# Patient Record
Sex: Male | Born: 1935 | Race: White | Hispanic: No | Marital: Married | Smoking: Former smoker
Health system: Southern US, Community
[De-identification: ages and names within clinical notes are randomized; demographics above are authoritative.]

## PROBLEM LIST (undated history)

## (undated) DIAGNOSIS — G4700 Insomnia, unspecified: Secondary | ICD-10-CM

## (undated) DIAGNOSIS — J449 Chronic obstructive pulmonary disease, unspecified: Secondary | ICD-10-CM

## (undated) DIAGNOSIS — J189 Pneumonia, unspecified organism: Secondary | ICD-10-CM

---

## 2005-01-30 ENCOUNTER — Ambulatory Visit: Payer: Self-pay | Admitting: Internal Medicine

## 2005-02-12 ENCOUNTER — Ambulatory Visit: Payer: Self-pay | Admitting: Vascular Surgery

## 2005-03-04 ENCOUNTER — Ambulatory Visit: Payer: Self-pay | Admitting: Internal Medicine

## 2005-03-26 ENCOUNTER — Inpatient Hospital Stay: Payer: Self-pay | Admitting: Vascular Surgery

## 2007-04-27 ENCOUNTER — Ambulatory Visit: Payer: Self-pay | Admitting: Unknown Physician Specialty

## 2008-08-02 ENCOUNTER — Emergency Department: Payer: Self-pay | Admitting: Emergency Medicine

## 2009-10-09 ENCOUNTER — Ambulatory Visit: Payer: Self-pay | Admitting: Unknown Physician Specialty

## 2009-12-26 ENCOUNTER — Ambulatory Visit: Payer: Self-pay | Admitting: Diagnostic Radiology

## 2010-07-30 ENCOUNTER — Ambulatory Visit: Payer: Self-pay | Admitting: Unknown Physician Specialty

## 2010-12-12 ENCOUNTER — Ambulatory Visit: Payer: Self-pay

## 2011-05-21 ENCOUNTER — Ambulatory Visit: Payer: Self-pay | Admitting: Oncology

## 2011-05-31 ENCOUNTER — Ambulatory Visit: Payer: Self-pay | Admitting: Internal Medicine

## 2011-06-04 ENCOUNTER — Ambulatory Visit: Payer: Self-pay | Admitting: Oncology

## 2011-06-11 ENCOUNTER — Ambulatory Visit: Payer: Self-pay | Admitting: Sports Medicine

## 2011-06-18 ENCOUNTER — Ambulatory Visit: Payer: Self-pay | Admitting: Oncology

## 2011-06-18 LAB — CBC CANCER CENTER
Basophil #: 0 x10 3/mm (ref 0.0–0.1)
Basophil %: 0.3 %
Eosinophil %: 4.4 %
HCT: 29.3 % — ABNORMAL LOW (ref 40.0–52.0)
HGB: 9.7 g/dL — ABNORMAL LOW (ref 13.0–18.0)
Lymphocyte #: 1.3 x10 3/mm (ref 1.0–3.6)
Lymphocyte %: 15.7 %
MCHC: 33 g/dL (ref 32.0–36.0)
MCV: 84 fL (ref 80–100)
Monocyte #: 0.6 x10 3/mm (ref 0.0–0.7)
Monocyte %: 7 %
Neutrophil %: 72.6 %
RBC: 3.5 10*6/uL — ABNORMAL LOW (ref 4.40–5.90)

## 2011-06-18 LAB — IRON AND TIBC
Iron Bind.Cap.(Total): 336 ug/dL (ref 250–450)
Iron Saturation: 16 %
Iron: 53 ug/dL — ABNORMAL LOW (ref 65–175)

## 2011-06-18 LAB — FERRITIN: Ferritin (ARMC): 26 ng/mL (ref 8–388)

## 2011-07-05 ENCOUNTER — Ambulatory Visit: Payer: Self-pay | Admitting: Oncology

## 2011-08-02 ENCOUNTER — Ambulatory Visit: Payer: Self-pay | Admitting: Oncology

## 2011-09-02 ENCOUNTER — Ambulatory Visit: Payer: Self-pay | Admitting: Oncology

## 2011-09-24 LAB — CANCER CENTER HEMOGLOBIN: HGB: 11 g/dL — ABNORMAL LOW (ref 13.0–18.0)

## 2011-10-02 ENCOUNTER — Ambulatory Visit: Payer: Self-pay | Admitting: Oncology

## 2011-11-02 ENCOUNTER — Ambulatory Visit: Payer: Self-pay | Admitting: Oncology

## 2011-11-19 LAB — CANCER CENTER HEMOGLOBIN: HGB: 10.5 g/dL — ABNORMAL LOW (ref 13.0–18.0)

## 2011-12-02 ENCOUNTER — Ambulatory Visit: Payer: Self-pay | Admitting: Oncology

## 2012-01-14 ENCOUNTER — Ambulatory Visit: Payer: Self-pay | Admitting: Oncology

## 2012-01-14 LAB — CANCER CENTER HEMOGLOBIN: HGB: 10.5 g/dL — ABNORMAL LOW (ref 13.0–18.0)

## 2012-01-14 LAB — FERRITIN: Ferritin (ARMC): 33 ng/mL (ref 8–388)

## 2012-01-14 LAB — IRON AND TIBC: Iron Saturation: 23 %

## 2012-02-02 ENCOUNTER — Ambulatory Visit: Payer: Self-pay | Admitting: Oncology

## 2012-02-07 ENCOUNTER — Ambulatory Visit: Payer: Self-pay | Admitting: Internal Medicine

## 2012-02-07 LAB — CREATININE, SERUM: Creatinine: 2.06 mg/dL — ABNORMAL HIGH (ref 0.60–1.30)

## 2012-03-10 ENCOUNTER — Ambulatory Visit: Payer: Self-pay | Admitting: Oncology

## 2012-04-03 ENCOUNTER — Ambulatory Visit: Payer: Self-pay | Admitting: Oncology

## 2012-05-05 ENCOUNTER — Ambulatory Visit: Payer: Self-pay | Admitting: Oncology

## 2012-05-05 LAB — CANCER CENTER HEMOGLOBIN: HGB: 10.9 g/dL — ABNORMAL LOW (ref 13.0–18.0)

## 2012-06-03 ENCOUNTER — Ambulatory Visit: Payer: Self-pay | Admitting: Oncology

## 2012-08-01 ENCOUNTER — Ambulatory Visit: Payer: Self-pay | Admitting: Oncology

## 2012-08-03 LAB — CBC CANCER CENTER
Basophil #: 0 x10 3/mm (ref 0.0–0.1)
Eosinophil #: 0.3 x10 3/mm (ref 0.0–0.7)
HCT: 35.4 % — ABNORMAL LOW (ref 40.0–52.0)
HGB: 12 g/dL — ABNORMAL LOW (ref 13.0–18.0)
Lymphocyte #: 1.7 x10 3/mm (ref 1.0–3.6)
Lymphocyte %: 24.6 %
MCHC: 33.9 g/dL (ref 32.0–36.0)
MCV: 87 fL (ref 80–100)
Neutrophil #: 4.4 x10 3/mm (ref 1.4–6.5)
Platelet: 160 x10 3/mm (ref 150–440)
WBC: 7 x10 3/mm (ref 3.8–10.6)

## 2012-08-03 LAB — CREATININE, SERUM: Creatinine: 2.13 mg/dL — ABNORMAL HIGH (ref 0.60–1.30)

## 2012-08-03 LAB — IRON AND TIBC
Iron Saturation: 23 %
Unbound Iron-Bind.Cap.: 254 ug/dL

## 2012-09-01 ENCOUNTER — Ambulatory Visit: Payer: Self-pay | Admitting: Oncology

## 2012-10-20 ENCOUNTER — Ambulatory Visit: Payer: Self-pay | Admitting: Ophthalmology

## 2012-10-20 LAB — CBC WITH DIFFERENTIAL/PLATELET
Basophil #: 0.1 10*3/uL (ref 0.0–0.1)
Basophil %: 0.8 %
Eosinophil #: 0.2 10*3/uL (ref 0.0–0.7)
HCT: 33.2 % — ABNORMAL LOW (ref 40.0–52.0)
HGB: 11 g/dL — ABNORMAL LOW (ref 13.0–18.0)
Lymphocyte #: 1.3 10*3/uL (ref 1.0–3.6)
Lymphocyte %: 14.1 %
MCH: 28.6 pg (ref 26.0–34.0)
MCHC: 33 g/dL (ref 32.0–36.0)
MCV: 87 fL (ref 80–100)
Monocyte #: 0.7 x10 3/mm (ref 0.2–1.0)
Monocyte %: 8 %
Platelet: 300 10*3/uL (ref 150–440)
RBC: 3.83 10*6/uL — ABNORMAL LOW (ref 4.40–5.90)
RDW: 15.4 % — ABNORMAL HIGH (ref 11.5–14.5)

## 2012-10-20 LAB — POTASSIUM: Potassium: 4.2 mmol/L (ref 3.5–5.1)

## 2012-11-02 ENCOUNTER — Ambulatory Visit: Payer: Self-pay | Admitting: Ophthalmology

## 2013-05-18 ENCOUNTER — Ambulatory Visit: Payer: Self-pay | Admitting: Ophthalmology

## 2013-05-18 LAB — CBC WITH DIFFERENTIAL/PLATELET
Eosinophil #: 0.1 10*3/uL (ref 0.0–0.7)
Eosinophil %: 1.8 %
HGB: 11.5 g/dL — ABNORMAL LOW (ref 13.0–18.0)
Lymphocyte #: 1.6 10*3/uL (ref 1.0–3.6)
Lymphocyte %: 18.6 %
MCH: 30.4 pg (ref 26.0–34.0)
MCV: 88 fL (ref 80–100)
Monocyte #: 0.6 x10 3/mm (ref 0.2–1.0)
Monocyte %: 6.5 %
RDW: 15.4 % — ABNORMAL HIGH (ref 11.5–14.5)
WBC: 8.5 10*3/uL (ref 3.8–10.6)

## 2013-05-18 LAB — POTASSIUM: Potassium: 3.8 mmol/L (ref 3.5–5.1)

## 2013-05-31 ENCOUNTER — Ambulatory Visit: Payer: Self-pay | Admitting: Ophthalmology

## 2013-07-17 ENCOUNTER — Inpatient Hospital Stay: Payer: Self-pay | Admitting: Psychiatry

## 2013-07-17 LAB — COMPREHENSIVE METABOLIC PANEL
ALT: 18 U/L (ref 12–78)
AST: 19 U/L (ref 15–37)
Albumin: 3.6 g/dL (ref 3.4–5.0)
Alkaline Phosphatase: 75 U/L
Anion Gap: 8 (ref 7–16)
BUN: 53 mg/dL — AB (ref 7–18)
Bilirubin,Total: 0.8 mg/dL (ref 0.2–1.0)
CHLORIDE: 99 mmol/L (ref 98–107)
CREATININE: 2.34 mg/dL — AB (ref 0.60–1.30)
Calcium, Total: 9.4 mg/dL (ref 8.5–10.1)
Co2: 27 mmol/L (ref 21–32)
EGFR (African American): 28 — ABNORMAL LOW
GFR CALC NON AF AMER: 24 — AB
Glucose: 119 mg/dL — ABNORMAL HIGH (ref 65–99)
OSMOLALITY: 284 (ref 275–301)
Potassium: 3.6 mmol/L (ref 3.5–5.1)
Sodium: 134 mmol/L — ABNORMAL LOW (ref 136–145)
Total Protein: 6.9 g/dL (ref 6.4–8.2)

## 2013-07-17 LAB — URINALYSIS, COMPLETE
BLOOD: NEGATIVE
Bacteria: NONE SEEN
Bilirubin,UR: NEGATIVE
Glucose,UR: NEGATIVE mg/dL (ref 0–75)
Hyaline Cast: 3
KETONE: NEGATIVE
LEUKOCYTE ESTERASE: NEGATIVE
Nitrite: NEGATIVE
PROTEIN: NEGATIVE
Ph: 6 (ref 4.5–8.0)
SPECIFIC GRAVITY: 1.005 (ref 1.003–1.030)
SQUAMOUS EPITHELIAL: NONE SEEN
WBC UR: <1 /HPF (ref 0–5)

## 2013-07-17 LAB — CBC
HCT: 37 % — AB (ref 40.0–52.0)
HGB: 12.6 g/dL — ABNORMAL LOW (ref 13.0–18.0)
MCH: 30.2 pg (ref 26.0–34.0)
MCHC: 33.9 g/dL (ref 32.0–36.0)
MCV: 89 fL (ref 80–100)
Platelet: 228 10*3/uL (ref 150–440)
RBC: 4.16 10*6/uL — ABNORMAL LOW (ref 4.40–5.90)
RDW: 14.7 % — AB (ref 11.5–14.5)
WBC: 11.4 10*3/uL — ABNORMAL HIGH (ref 3.8–10.6)

## 2013-07-17 LAB — SALICYLATE LEVEL: Salicylates, Serum: <1.7 mg/dL

## 2013-07-17 LAB — DRUG SCREEN, URINE
AMPHETAMINES, UR SCREEN: NEGATIVE (ref ?–1000)
Barbiturates, Ur Screen: NEGATIVE (ref ?–200)
Benzodiazepine, Ur Scrn: NEGATIVE (ref ?–200)
Cannabinoid 50 Ng, Ur ~~LOC~~: NEGATIVE (ref ?–50)
Cocaine Metabolite,Ur ~~LOC~~: NEGATIVE (ref ?–300)
MDMA (ECSTASY) UR SCREEN: NEGATIVE (ref ?–500)
METHADONE, UR SCREEN: NEGATIVE (ref ?–300)
Opiate, Ur Screen: POSITIVE (ref ?–300)
PHENCYCLIDINE (PCP) UR S: NEGATIVE (ref ?–25)
Tricyclic, Ur Screen: NEGATIVE (ref ?–1000)

## 2013-07-17 LAB — ETHANOL
Ethanol %: <0.003 % (ref 0.000–0.080)
Ethanol: <3 mg/dL

## 2013-07-17 LAB — ACETAMINOPHEN LEVEL: ACETAMINOPHEN: 4 ug/mL — AB

## 2013-07-17 LAB — TSH: THYROID STIMULATING HORM: 1.78 u[IU]/mL

## 2014-02-20 ENCOUNTER — Emergency Department: Payer: Self-pay | Admitting: Emergency Medicine

## 2014-02-20 LAB — URINALYSIS, COMPLETE
Bacteria: NONE SEEN
Bilirubin,UR: NEGATIVE
Blood: NEGATIVE
Glucose,UR: NEGATIVE mg/dL (ref 0–75)
KETONE: NEGATIVE
LEUKOCYTE ESTERASE: NEGATIVE
Nitrite: NEGATIVE
Ph: 5 (ref 4.5–8.0)
Protein: NEGATIVE
SPECIFIC GRAVITY: 1.005 (ref 1.003–1.030)

## 2014-02-20 LAB — BASIC METABOLIC PANEL
Anion Gap: 9 (ref 7–16)
BUN: 55 mg/dL — ABNORMAL HIGH (ref 7–18)
CHLORIDE: 97 mmol/L — AB (ref 98–107)
CO2: 24 mmol/L (ref 21–32)
Calcium, Total: 8.4 mg/dL — ABNORMAL LOW (ref 8.5–10.1)
Creatinine: 2.47 mg/dL — ABNORMAL HIGH (ref 0.60–1.30)
EGFR (African American): 26 — ABNORMAL LOW
EGFR (Non-African Amer.): 22 — ABNORMAL LOW
GLUCOSE: 117 mg/dL — AB (ref 65–99)
Osmolality: 277 (ref 275–301)
Potassium: 3.9 mmol/L (ref 3.5–5.1)
SODIUM: 130 mmol/L — AB (ref 136–145)

## 2014-02-20 LAB — CBC
HCT: 31.8 % — ABNORMAL LOW (ref 40.0–52.0)
HGB: 10.1 g/dL — AB (ref 13.0–18.0)
MCH: 26.7 pg (ref 26.0–34.0)
MCHC: 31.6 g/dL — ABNORMAL LOW (ref 32.0–36.0)
MCV: 85 fL (ref 80–100)
PLATELETS: 188 10*3/uL (ref 150–440)
RBC: 3.76 10*6/uL — ABNORMAL LOW (ref 4.40–5.90)
RDW: 17.1 % — ABNORMAL HIGH (ref 11.5–14.5)
WBC: 13.4 10*3/uL — ABNORMAL HIGH (ref 3.8–10.6)

## 2014-02-20 LAB — TROPONIN I: Troponin-I: <0.02 ng/mL

## 2014-02-21 ENCOUNTER — Inpatient Hospital Stay: Payer: Self-pay | Admitting: Internal Medicine

## 2014-02-22 LAB — CBC WITH DIFFERENTIAL/PLATELET
BASOS ABS: 0 10*3/uL (ref 0.0–0.1)
Basophil %: 0.2 %
Eosinophil #: 0 10*3/uL (ref 0.0–0.7)
Eosinophil %: 0.2 %
HCT: 30.6 % — AB (ref 40.0–52.0)
HGB: 10.1 g/dL — ABNORMAL LOW (ref 13.0–18.0)
LYMPHS PCT: 6.4 %
Lymphocyte #: 0.7 10*3/uL — ABNORMAL LOW (ref 1.0–3.6)
MCH: 27.5 pg (ref 26.0–34.0)
MCHC: 33 g/dL (ref 32.0–36.0)
MCV: 83 fL (ref 80–100)
MONO ABS: 1 x10 3/mm (ref 0.2–1.0)
Monocyte %: 8.7 %
Neutrophil #: 9.8 10*3/uL — ABNORMAL HIGH (ref 1.4–6.5)
Neutrophil %: 84.5 %
Platelet: 197 10*3/uL (ref 150–440)
RBC: 3.67 10*6/uL — ABNORMAL LOW (ref 4.40–5.90)
RDW: 17.3 % — AB (ref 11.5–14.5)
WBC: 11.6 10*3/uL — ABNORMAL HIGH (ref 3.8–10.6)

## 2014-02-22 LAB — BASIC METABOLIC PANEL
ANION GAP: 9 (ref 7–16)
BUN: 47 mg/dL — AB (ref 7–18)
CHLORIDE: 99 mmol/L (ref 98–107)
CREATININE: 2.49 mg/dL — AB (ref 0.60–1.30)
Calcium, Total: 8.3 mg/dL — ABNORMAL LOW (ref 8.5–10.1)
Co2: 25 mmol/L (ref 21–32)
EGFR (Non-African Amer.): 22 — ABNORMAL LOW
GFR CALC AF AMER: 26 — AB
GLUCOSE: 103 mg/dL — AB (ref 65–99)
OSMOLALITY: 279 (ref 275–301)
POTASSIUM: 3.8 mmol/L (ref 3.5–5.1)
Sodium: 133 mmol/L — ABNORMAL LOW (ref 136–145)

## 2014-02-23 LAB — CBC WITH DIFFERENTIAL/PLATELET
BASOS PCT: 0.2 %
Basophil #: 0 10*3/uL (ref 0.0–0.1)
EOS ABS: 0.1 10*3/uL (ref 0.0–0.7)
Eosinophil %: 1 %
HCT: 31.7 % — ABNORMAL LOW (ref 40.0–52.0)
HGB: 10.3 g/dL — ABNORMAL LOW (ref 13.0–18.0)
LYMPHS ABS: 0.7 10*3/uL — AB (ref 1.0–3.6)
Lymphocyte %: 6 %
MCH: 27.4 pg (ref 26.0–34.0)
MCHC: 32.5 g/dL (ref 32.0–36.0)
MCV: 84 fL (ref 80–100)
MONOS PCT: 7.7 %
Monocyte #: 0.9 x10 3/mm (ref 0.2–1.0)
NEUTROS ABS: 9.9 10*3/uL — AB (ref 1.4–6.5)
Neutrophil %: 85.1 %
PLATELETS: 226 10*3/uL (ref 150–440)
RBC: 3.76 10*6/uL — ABNORMAL LOW (ref 4.40–5.90)
RDW: 17.6 % — ABNORMAL HIGH (ref 11.5–14.5)
WBC: 11.6 10*3/uL — ABNORMAL HIGH (ref 3.8–10.6)

## 2014-02-23 LAB — BASIC METABOLIC PANEL
ANION GAP: 9 (ref 7–16)
BUN: 47 mg/dL — ABNORMAL HIGH (ref 7–18)
CREATININE: 2.39 mg/dL — AB (ref 0.60–1.30)
Calcium, Total: 8.6 mg/dL (ref 8.5–10.1)
Chloride: 101 mmol/L (ref 98–107)
Co2: 24 mmol/L (ref 21–32)
EGFR (Non-African Amer.): 23 — ABNORMAL LOW
GFR CALC AF AMER: 27 — AB
GLUCOSE: 112 mg/dL — AB (ref 65–99)
Osmolality: 281 (ref 275–301)
Potassium: 3.8 mmol/L (ref 3.5–5.1)
Sodium: 134 mmol/L — ABNORMAL LOW (ref 136–145)

## 2014-02-24 LAB — BASIC METABOLIC PANEL
Anion Gap: 9 (ref 7–16)
BUN: 49 mg/dL — ABNORMAL HIGH (ref 7–18)
CHLORIDE: 105 mmol/L (ref 98–107)
Calcium, Total: 8.1 mg/dL — ABNORMAL LOW (ref 8.5–10.1)
Co2: 24 mmol/L (ref 21–32)
Creatinine: 2.01 mg/dL — ABNORMAL HIGH (ref 0.60–1.30)
EGFR (African American): 41 — ABNORMAL LOW
EGFR (Non-African Amer.): 33 — ABNORMAL LOW
Glucose: 99 mg/dL (ref 65–99)
Osmolality: 289 (ref 275–301)
Potassium: 4.2 mmol/L (ref 3.5–5.1)
Sodium: 138 mmol/L (ref 136–145)

## 2014-02-24 LAB — CBC WITH DIFFERENTIAL/PLATELET
BASOS ABS: 0 10*3/uL (ref 0.0–0.1)
BASOS PCT: 0.3 %
Eosinophil #: 0.2 10*3/uL (ref 0.0–0.7)
Eosinophil %: 2.2 %
HCT: 29.1 % — ABNORMAL LOW (ref 40.0–52.0)
HGB: 9.7 g/dL — ABNORMAL LOW (ref 13.0–18.0)
LYMPHS PCT: 8.3 %
Lymphocyte #: 0.8 10*3/uL — ABNORMAL LOW (ref 1.0–3.6)
MCH: 28.1 pg (ref 26.0–34.0)
MCHC: 33.5 g/dL (ref 32.0–36.0)
MCV: 84 fL (ref 80–100)
MONO ABS: 0.7 x10 3/mm (ref 0.2–1.0)
MONOS PCT: 7.8 %
NEUTROS PCT: 81.4 %
Neutrophil #: 7.7 10*3/uL — ABNORMAL HIGH (ref 1.4–6.5)
PLATELETS: 224 10*3/uL (ref 150–440)
RBC: 3.46 10*6/uL — ABNORMAL LOW (ref 4.40–5.90)
RDW: 17.3 % — ABNORMAL HIGH (ref 11.5–14.5)
WBC: 9.5 10*3/uL (ref 3.8–10.6)

## 2014-02-25 LAB — CBC WITH DIFFERENTIAL/PLATELET
BASOS PCT: 0.1 %
Basophil #: 0 10*3/uL (ref 0.0–0.1)
EOS ABS: 0 10*3/uL (ref 0.0–0.7)
Eosinophil %: 0 %
HCT: 28.5 % — ABNORMAL LOW (ref 40.0–52.0)
HGB: 9.5 g/dL — AB (ref 13.0–18.0)
Lymphocyte #: 0.4 10*3/uL — ABNORMAL LOW (ref 1.0–3.6)
Lymphocyte %: 3.7 %
MCH: 27.8 pg (ref 26.0–34.0)
MCHC: 33.3 g/dL (ref 32.0–36.0)
MCV: 84 fL (ref 80–100)
MONO ABS: 0.2 x10 3/mm (ref 0.2–1.0)
Monocyte %: 1.5 %
Neutrophil #: 9.9 10*3/uL — ABNORMAL HIGH (ref 1.4–6.5)
Neutrophil %: 94.7 %
PLATELETS: 244 10*3/uL (ref 150–440)
RBC: 3.41 10*6/uL — AB (ref 4.40–5.90)
RDW: 17.6 % — AB (ref 11.5–14.5)
WBC: 10.5 10*3/uL (ref 3.8–10.6)

## 2014-02-25 LAB — BASIC METABOLIC PANEL
Anion Gap: 10 (ref 7–16)
BUN: 68 mg/dL — ABNORMAL HIGH (ref 7–18)
CHLORIDE: 103 mmol/L (ref 98–107)
CO2: 24 mmol/L (ref 21–32)
Calcium, Total: 8.7 mg/dL (ref 8.5–10.1)
Creatinine: 2.19 mg/dL — ABNORMAL HIGH (ref 0.60–1.30)
EGFR (African American): 37 — ABNORMAL LOW
EGFR (Non-African Amer.): 30 — ABNORMAL LOW
Glucose: 221 mg/dL — ABNORMAL HIGH (ref 65–99)
Osmolality: 300 (ref 275–301)
Potassium: 3.8 mmol/L (ref 3.5–5.1)
SODIUM: 137 mmol/L (ref 136–145)

## 2014-02-26 LAB — CBC WITH DIFFERENTIAL/PLATELET
Basophil #: 0 10*3/uL (ref 0.0–0.1)
Basophil %: 0.1 %
EOS ABS: 0 10*3/uL (ref 0.0–0.7)
Eosinophil %: 0.1 %
HCT: 28.9 % — ABNORMAL LOW (ref 40.0–52.0)
HGB: 9 g/dL — AB (ref 13.0–18.0)
LYMPHS ABS: 0.5 10*3/uL — AB (ref 1.0–3.6)
Lymphocyte %: 2.8 %
MCH: 26.4 pg (ref 26.0–34.0)
MCHC: 31.2 g/dL — ABNORMAL LOW (ref 32.0–36.0)
MCV: 85 fL (ref 80–100)
MONOS PCT: 2 %
Monocyte #: 0.3 x10 3/mm (ref 0.2–1.0)
NEUTROS PCT: 95 %
Neutrophil #: 15.5 10*3/uL — ABNORMAL HIGH (ref 1.4–6.5)
PLATELETS: 254 10*3/uL (ref 150–440)
RBC: 3.41 10*6/uL — ABNORMAL LOW (ref 4.40–5.90)
RDW: 17.7 % — ABNORMAL HIGH (ref 11.5–14.5)
WBC: 16.3 10*3/uL — AB (ref 3.8–10.6)

## 2014-02-26 LAB — BASIC METABOLIC PANEL
ANION GAP: 6 — AB (ref 7–16)
BUN: 88 mg/dL — AB (ref 7–18)
CALCIUM: 8.2 mg/dL — AB (ref 8.5–10.1)
CO2: 24 mmol/L (ref 21–32)
CREATININE: 2.3 mg/dL — AB (ref 0.60–1.30)
Chloride: 103 mmol/L (ref 98–107)
EGFR (African American): 35 — ABNORMAL LOW
GFR CALC NON AF AMER: 29 — AB
GLUCOSE: 325 mg/dL — AB (ref 65–99)
Osmolality: 306 (ref 275–301)
Potassium: 4.1 mmol/L (ref 3.5–5.1)
SODIUM: 133 mmol/L — AB (ref 136–145)

## 2014-02-26 LAB — CULTURE, BLOOD (SINGLE)

## 2014-02-27 LAB — BASIC METABOLIC PANEL
Anion Gap: 8 (ref 7–16)
BUN: 103 mg/dL — ABNORMAL HIGH (ref 7–18)
CHLORIDE: 103 mmol/L (ref 98–107)
Calcium, Total: 8.6 mg/dL (ref 8.5–10.1)
Co2: 23 mmol/L (ref 21–32)
Creatinine: 2.19 mg/dL — ABNORMAL HIGH (ref 0.60–1.30)
EGFR (African American): 37 — ABNORMAL LOW
EGFR (Non-African Amer.): 30 — ABNORMAL LOW
Glucose: 191 mg/dL — ABNORMAL HIGH (ref 65–99)
Osmolality: 306 (ref 275–301)
Potassium: 4.2 mmol/L (ref 3.5–5.1)
SODIUM: 134 mmol/L — AB (ref 136–145)

## 2014-02-27 LAB — CBC WITH DIFFERENTIAL/PLATELET
BASOS ABS: 0 10*3/uL (ref 0.0–0.1)
BASOS PCT: 0 %
Eosinophil #: 0 10*3/uL (ref 0.0–0.7)
Eosinophil %: 0 %
HCT: 29 % — AB (ref 40.0–52.0)
HGB: 9.5 g/dL — AB (ref 13.0–18.0)
LYMPHS PCT: 2.9 %
Lymphocyte #: 0.5 10*3/uL — ABNORMAL LOW (ref 1.0–3.6)
MCH: 27.4 pg (ref 26.0–34.0)
MCHC: 32.7 g/dL (ref 32.0–36.0)
MCV: 84 fL (ref 80–100)
Monocyte #: 0.4 x10 3/mm (ref 0.2–1.0)
Monocyte %: 2.3 %
Neutrophil #: 15.4 10*3/uL — ABNORMAL HIGH (ref 1.4–6.5)
Neutrophil %: 94.8 %
Platelet: 283 10*3/uL (ref 150–440)
RBC: 3.46 10*6/uL — ABNORMAL LOW (ref 4.40–5.90)
RDW: 17.8 % — ABNORMAL HIGH (ref 11.5–14.5)
WBC: 16.3 10*3/uL — ABNORMAL HIGH (ref 3.8–10.6)

## 2014-02-28 LAB — CBC WITH DIFFERENTIAL/PLATELET
BASOS PCT: 0.1 %
Basophil #: 0 10*3/uL (ref 0.0–0.1)
Eosinophil #: 0 10*3/uL (ref 0.0–0.7)
Eosinophil %: 0 %
HCT: 28.2 % — ABNORMAL LOW (ref 40.0–52.0)
HGB: 8.7 g/dL — ABNORMAL LOW (ref 13.0–18.0)
Lymphocyte #: 0.4 10*3/uL — ABNORMAL LOW (ref 1.0–3.6)
Lymphocyte %: 3.2 %
MCH: 26.2 pg (ref 26.0–34.0)
MCHC: 31.1 g/dL — ABNORMAL LOW (ref 32.0–36.0)
MCV: 84 fL (ref 80–100)
MONO ABS: 0.4 x10 3/mm (ref 0.2–1.0)
MONOS PCT: 3 %
Neutrophil #: 13 10*3/uL — ABNORMAL HIGH (ref 1.4–6.5)
Neutrophil %: 93.7 %
Platelet: 271 10*3/uL (ref 150–440)
RBC: 3.34 10*6/uL — ABNORMAL LOW (ref 4.40–5.90)
RDW: 17.3 % — ABNORMAL HIGH (ref 11.5–14.5)
WBC: 13.9 10*3/uL — ABNORMAL HIGH (ref 3.8–10.6)

## 2014-02-28 LAB — BASIC METABOLIC PANEL
Anion Gap: 8 (ref 7–16)
BUN: 103 mg/dL — AB (ref 7–18)
CHLORIDE: 104 mmol/L (ref 98–107)
CO2: 23 mmol/L (ref 21–32)
Calcium, Total: 8.6 mg/dL (ref 8.5–10.1)
Creatinine: 2.16 mg/dL — ABNORMAL HIGH (ref 0.60–1.30)
EGFR (African American): 37 — ABNORMAL LOW
EGFR (Non-African Amer.): 31 — ABNORMAL LOW
Glucose: 187 mg/dL — ABNORMAL HIGH (ref 65–99)
Osmolality: 307 (ref 275–301)
Potassium: 4.4 mmol/L (ref 3.5–5.1)
Sodium: 135 mmol/L — ABNORMAL LOW (ref 136–145)

## 2014-03-01 DIAGNOSIS — I4891 Unspecified atrial fibrillation: Secondary | ICD-10-CM

## 2014-03-01 DIAGNOSIS — J13 Pneumonia due to Streptococcus pneumoniae: Secondary | ICD-10-CM

## 2014-03-01 DIAGNOSIS — E119 Type 2 diabetes mellitus without complications: Secondary | ICD-10-CM

## 2014-03-01 DIAGNOSIS — N184 Chronic kidney disease, stage 4 (severe): Secondary | ICD-10-CM

## 2014-03-01 DIAGNOSIS — J441 Chronic obstructive pulmonary disease with (acute) exacerbation: Secondary | ICD-10-CM

## 2014-03-14 DIAGNOSIS — R140 Abdominal distension (gaseous): Secondary | ICD-10-CM

## 2014-03-14 DIAGNOSIS — R5381 Other malaise: Secondary | ICD-10-CM

## 2014-03-14 DIAGNOSIS — R197 Diarrhea, unspecified: Secondary | ICD-10-CM

## 2014-03-21 DIAGNOSIS — J189 Pneumonia, unspecified organism: Secondary | ICD-10-CM

## 2014-03-21 DIAGNOSIS — I480 Paroxysmal atrial fibrillation: Secondary | ICD-10-CM

## 2014-03-21 DIAGNOSIS — J449 Chronic obstructive pulmonary disease, unspecified: Secondary | ICD-10-CM

## 2014-03-21 DIAGNOSIS — F39 Unspecified mood [affective] disorder: Secondary | ICD-10-CM

## 2014-03-30 DIAGNOSIS — L253 Unspecified contact dermatitis due to other chemical products: Secondary | ICD-10-CM

## 2014-04-04 DIAGNOSIS — L2081 Atopic neurodermatitis: Secondary | ICD-10-CM

## 2014-04-04 DIAGNOSIS — J449 Chronic obstructive pulmonary disease, unspecified: Secondary | ICD-10-CM

## 2014-04-11 DIAGNOSIS — R0609 Other forms of dyspnea: Secondary | ICD-10-CM

## 2014-04-20 DIAGNOSIS — M25519 Pain in unspecified shoulder: Secondary | ICD-10-CM

## 2014-04-20 DIAGNOSIS — J449 Chronic obstructive pulmonary disease, unspecified: Secondary | ICD-10-CM

## 2014-04-27 DIAGNOSIS — L03115 Cellulitis of right lower limb: Secondary | ICD-10-CM

## 2014-05-02 DIAGNOSIS — L03116 Cellulitis of left lower limb: Secondary | ICD-10-CM

## 2014-05-19 DIAGNOSIS — N183 Chronic kidney disease, stage 3 (moderate): Secondary | ICD-10-CM

## 2014-05-19 DIAGNOSIS — J449 Chronic obstructive pulmonary disease, unspecified: Secondary | ICD-10-CM

## 2014-05-19 DIAGNOSIS — I481 Persistent atrial fibrillation: Secondary | ICD-10-CM

## 2014-05-25 DIAGNOSIS — M1712 Unilateral primary osteoarthritis, left knee: Secondary | ICD-10-CM

## 2014-05-30 DIAGNOSIS — L299 Pruritus, unspecified: Secondary | ICD-10-CM

## 2014-06-12 ENCOUNTER — Telehealth: Payer: Self-pay | Admitting: Internal Medicine

## 2014-06-13 ENCOUNTER — Telehealth: Payer: Self-pay

## 2014-06-13 NOTE — Telephone Encounter (Signed)
PLEASE NOTE: All timestamps contained within this report are represented as [place]. CONFIDENTIALTY NOTICE: This fax transmission is intended only for the addressee. It contains information that is legally privileged, confidential or otherwise protected from use or disclosure. If you are not the intended recipient, you are strictly prohibited from reviewing, disclosing, copying using or disseminating any of this information or taking any action in reliance on or regarding this information. If you have received this fax in error, please notify us immediately by telephone so that we can arrange for its return to us. Phone: [phone], Toll-Free: [phone], Fax: [phone] Page: 1 of 2 Call Id: [id number] [facility] - Client TELEPHONE ADVICE RECORD [facility] Patient Name: Calvin MerinoMAX Tucker Gender: Male DOB: [birth year] Age: 81 Y 5 M 17 D Return Phone Number: [phone] (Primary) Address: City/State/Zip: [place] Client [facility] Night - Client Client Site [facility] - Night Physician Tillman AbideLetvak, Richard Contact Type Call Call Type Triage / Clinical Caller Name [facility] [phone] Relationship To Patient Care Giver Return Phone Number [phone] (Primary) Chief Complaint Dizziness Initial Comment Caller states that her patient is complaining of dizziness. Multiple cardiac problems. Dtr wanted on-call contacted. BP has been fluctuating since new medication was ordered. PreDisposition Did not know what to do Nurse Assessment Nurse: Deloria LairHamilton, RN, Trish Date/Time [name](Eastern Time): 06/12/2014 12:52:35 PM Confirm and document reason for call. If symptomatic, describe symptoms. ---Caller states that her patient is complaining of dizziness. Multiple cardiac problems. Dtr wanted on-call contacted. BP has been fluctuating since new medication was ordered. Was recently placed on  visteril. Blood pressure is 120/72 it has been 80/60 before new med 130/70. Has the patient traveled out of the country within the last 30 days? ---No Does the patient require triage? ---Yes Related visit to physician within the last 2 weeks? ---Yes Does the PT have any chronic conditions? (i.e. diabetes, asthma, etc.) ---Yes List chronic conditions. ---diabetes Has dizziness when standing and walking. Daughter is insistent on on call MD be notified. metaprolol lorazapam tramadol dexamethasone trasadone nasal spray and inhaler PRN added visteril scheduled was PRN. 2 times a day now. itching restless leg eating and drinking like normal depression. Guidelines Guideline Title Affirmed Question Affirmed Notes Nurse Date/Time (Eastern Time) Dizziness - Lightheadedness [1] MILD dizziness (e.g., walking normally) AND [2] has NOT been evaluated by physician for this (Exception: dizziness caused by heat exposure, Deloria LairHamilton, RN, Trish 06/12/2014 1:06:18 PM PLEASE NOTE: All timestamps contained within this report are represented as [place]. CONFIDENTIALTY NOTICE: This fax transmission is intended only for the addressee. It contains information that is legally privileged, confidential or otherwise protected from use or disclosure. If you are not the intended recipient, you are strictly prohibited from reviewing, disclosing, copying using or disseminating any of this information or taking any action in reliance on or regarding this information. If you have received this fax in error, please notify us immediately by telephone so that we can arrange for its return to us. Phone: [phone], Toll-Free: [phone], Fax: [phone] Page: 2 of 2 Call Id: [id number] Guidelines Guideline Title Affirmed Question Affirmed Notes Nurse Date/Time [name](Eastern Time) sudden standing, or poor fluid intake) Disp. Time [name](Eastern Time) Disposition Final User 06/12/2014 12:29:43 PM Send To Clinical  Follow Up Denice BorsQueue Beeler, Carolyn 06/12/2014 1:09:00 PM See PCP When Office is Open (within 3 days) Yes Deloria LairHamilton, RN, Les Pourish Caller Understands: Yes Disagree/Comply: Comply Care Advice Given Per Guideline *  Passes out (faints) CALL BACK IF: * You become worse. * Sit at side of bed for several minutes before standing up. Stand up slowly. * Avoid sudden movements of head. FALL PREVENTION: REST: Lie down with feet elevated for 1 hour. This will improve circulation and increase blood flow to the brain. FLUIDS: Drink several glasses of fruit juice, other clear fluids or water. This will improve hydration and blood glucose. If the weather is hot, make sure the fluids are cold. SEE PCP WITHIN 3 DAYS: You need to be examined within 2 or 3 days. Call your doctor during regular office hours and make an appointment. (Note: if office will be open tomorrow, tell caller to call then, not in 3 days). After Care Instructions Given Call Event Type User Date / Time Description

## 2014-06-13 NOTE — Telephone Encounter (Signed)
Vistaril decreased to 25 bid from 25/50 He appeared his normal self today

## 2014-06-17 DIAGNOSIS — K5900 Constipation, unspecified: Secondary | ICD-10-CM

## 2014-06-22 DIAGNOSIS — G2581 Restless legs syndrome: Secondary | ICD-10-CM

## 2014-06-29 DIAGNOSIS — R296 Repeated falls: Secondary | ICD-10-CM

## 2014-06-29 DIAGNOSIS — R440 Auditory hallucinations: Secondary | ICD-10-CM

## 2014-06-29 DIAGNOSIS — F419 Anxiety disorder, unspecified: Secondary | ICD-10-CM

## 2014-07-05 ENCOUNTER — Ambulatory Visit: Payer: Self-pay | Admitting: Internal Medicine

## 2014-07-28 DIAGNOSIS — I480 Paroxysmal atrial fibrillation: Secondary | ICD-10-CM | POA: Diagnosis not present

## 2014-07-28 DIAGNOSIS — F3341 Major depressive disorder, recurrent, in partial remission: Secondary | ICD-10-CM

## 2014-07-28 DIAGNOSIS — J441 Chronic obstructive pulmonary disease with (acute) exacerbation: Secondary | ICD-10-CM | POA: Diagnosis not present

## 2014-07-28 DIAGNOSIS — N184 Chronic kidney disease, stage 4 (severe): Secondary | ICD-10-CM | POA: Diagnosis not present

## 2014-07-28 DIAGNOSIS — I270 Primary pulmonary hypertension: Secondary | ICD-10-CM

## 2014-08-22 DIAGNOSIS — R21 Rash and other nonspecific skin eruption: Secondary | ICD-10-CM | POA: Diagnosis not present

## 2014-09-06 DIAGNOSIS — L2081 Atopic neurodermatitis: Secondary | ICD-10-CM | POA: Diagnosis not present

## 2014-09-20 DIAGNOSIS — J9611 Chronic respiratory failure with hypoxia: Secondary | ICD-10-CM | POA: Diagnosis not present

## 2014-09-20 DIAGNOSIS — I270 Primary pulmonary hypertension: Secondary | ICD-10-CM | POA: Diagnosis not present

## 2014-09-20 DIAGNOSIS — R001 Bradycardia, unspecified: Secondary | ICD-10-CM

## 2014-09-23 NOTE — Op Note (Signed)
PATIENT NAME:  Calvin Tucker, Calvin Tucker MR#:  [id number] DATE OF BIRTH:  1923-12-08  DATE OF PROCEDURE:  11/02/2012  PREOPERATIVE DIAGNOSIS: Cataract, right eye.   POSTOPERATIVE DIAGNOSIS: Cataract right eye  PROCEDURE PERFORMED: Extracapsular cataract extraction using phacoemulsification with placement Alcon [id number] diopter posterior chamber lens with 5.0 diopters of cylinder serial number [id number].   SURGEON: Maylon PeppersSteven A. Laaibah Wartman, M.D.   ANESTHESIA: 4% lidocaine and 0.75% Marcaine in a 50/50 mixture with 10 units/mL of Hylenex added, given as a peribulbar.   ANESTHESIOLOGIST: Trevor IhaAmitabh Y. Bhandari, MD  COMPLICATIONS: None.   ESTIMATED BLOOD LOSS: Less than 1 mL.   DESCRIPTION OF PROCEDURE: The patient was brought to the operating room and set upright. The eyes were both anesthetized with proparacaine and with the patient looking at a distant target on the wall, the 3 o'clock and the 9 o'clock positions were marked using a ASICO toric marker. The 6 o'clock position was marked freehand with a pen. The patient was placed supine and given IV sedation and peribulbar block. He was then prepped and draped in the usual fashion. The vertical rectus muscles were imbricated using 5-0 silk sutures, bridle sutures. Toric marker was placed over the eye lined up with the 3 marks at the limbus and the 11 degree position was marked on each eye. The100 degree position was marked superiorly for positioning of the incision. A limbal peritomy was then carried out for 1 clock hour at 110 degrees. Hemostasis was obtained with cautery and a partial thickness scleral groove was made at the posterior surgical limbus, the second anteriorly and to clear cornea with an Alcon crescent knife. The anterior chamber was entered superotemporally through clear cornea with using a paracentesis knife and through the lamellar dissection with a 2.6 mm keratome. DisCoVisc was used to place the  aqueous and continuous tear circular capsulorrhexis  was carried out. Hydrodelineation was used to loosen the nucleus and phacoemulsification was carried out in a divide and conquer technique. Ultrasound time was one minute and 50 seconds with an average power of 23.1%. CDE 45.94. Irrigation-aspiration was used to remove the residual cortex and the capsular bag was inflated using DisCoVisc. The intraocular lens was inserted in the capsular bag using a [employer] shooter. The lens was rotated and lined up with 11 degree marks. Irrigation-aspiration was used to remove the residual DisCoVisc and the wound was inflated with balanced salt. Miostat was injected through the paracentesis track. One-tenth of a milliliter of cefuroxime was injected paracentesis track. The lens was checked for positioning and found to be in good position. The wound was checked for leaks. None were found. The conjunctiva was closed with cautery. The bridle sutures were removed. Two drops of Vigamox were placed in the eye. A shield was placed on the eye. The patient was discharged to the recovery room in good condition    ____________________________ Maylon PeppersSteven A. Takira Sherrin, MD sad:cc D: 11/02/2012 14:01:28 ET T: 11/02/2012 16:36:22 ET JOB#: [id number]  cc: Viviann SpareSteven A. Graysen Depaula, MD, <Dictator> Erline LevineSTEVEN A Anjolina Byrer MD ELECTRONICALLY SIGNED 11/09/2012 14:12

## 2014-09-24 NOTE — Discharge Summary (Signed)
PATIENT NAME:  [name], Calvin E MR#:  [id number] DATE OF BIRTH:  December 16, 1923  DATE OF ADMISSION:  02/21/2014 DATE OF DISCHARGE:  03/01/2014  DISCHARGE DIAGNOSES: 1.  Pneumonia.  2.  Chronic obstructive pulmonary disease flare.  3.  Thrush. 4.  Insomnia.   DISCHARGE MEDICATIONS: Per [facility] med reconciliation. Please see for details. Briefly, the cefuroxime should be for 3 more days and the dexamethasone taper should be decreasing 1 mg every 3 days until off. He is on 4 mg b.i.d. now so in 3 days he should be on 3 mg b.i.d. for three more days, 2 mg b.i.d. for three more days, 1 mg b.i.d. and then he will be done after 3 more days. The fluconazole should be for a week. All other meds were continuous.   HISTORY AND PHYSICAL: Please see detailed history and physical done on admission.   HOSPITAL COURSE: The patient was admitted with the above. He was started on antibiotics, slowly improved, although became more short of breath after several days and was wheezing. Steroids were started, prednisone allergy is noted, but no cross reactivity with dexamethasone is usually observed so that was started. He tolerated it well, improved nicely. Bronchodilators helped as well, although he became a little tachycardic with that. Diltiazem was added with his A-fib. This seemed to help slow down. He cannot take anticoagulants given his bleeding diaphysis with hemorrhagic hereditary telangiectasias, HHT. He progressed with therapy slowly, but still short of breath, tachycardic on exertion, however. He needs a repeat chest x-ray in 4 to 6 weeks, which will need to be scheduled out as an outpatient at the skilled facility. White blood cell count was intermittently elevated, particularly worse after the steroids, up to 16,000, but did improve closer to 13,000 by discharge. He was mildly hyperglycemic after the steroids, which improved as that was lowering. His chronic kidney disease remained present with his creatinine around 2.  That was basically unchanged.  TIME SPENT: It took approximately 35 minutes to do all discharge tasks today.  ____________________________ Marya AmslerMarshall W. Dareen PianoAnderson, MD mwa:sb D: 03/01/2014 07:26:29 ET T: 03/01/2014 07:44:43 ET JOB#: [id number]  cc: Marya AmslerMarshall W. Dareen PianoAnderson, MD, <Dictator> Lauro RegulusMARSHALL W Raelle Chambers MD ELECTRONICALLY SIGNED 03/01/2014 8:24

## 2014-09-24 NOTE — Discharge Summary (Signed)
PATIENT NAME:  Calvin Tucker, Alazar E MR#:  [id number] DATE OF BIRTH:  [birth year]  DATE OF ADMISSION:  07/17/2013 DATE OF DISCHARGE:  07/23/2013  HOSPITAL COURSE: See dictated history and physical for details of admission. This 81 year old gentleman with minimal  past psychiatric history was admitted to the hospital with fairly acute symptoms of depression with suicidal ideation and some psychotic, paranoid thinking. This could be endogenous major depression, but there was also some possibility of it being related to recent treatment with steroids or other medication. In the hospital, the patient was cooperative with treatment. He did not engage in any dangerous behavior. He was treated with a combination of medication and therapy. He tolerated a combination of fluoxetine and low dose aripiprazole. Showed good response to this and over a few days had a complete resolution of psychotic symptoms and suicidal ideation. Mood became brighter and more upbeat. He was able to interact with his family appropriately and was no longer feeling paranoid. His medical problems including his prostate disease, coronary artery disease, and chronic pain were all treated and did not become more of a problem during his hospital stay. At the time of discharge the patient showed significant improvement. Agreed to follow-up treatment with psychiatry locally. He was agreeable to going back to stay with his family and they were comfortable with his returning home as well.   DISCHARGE MEDICATIONS: Finasteride 5 mg once a day, Tylenol 650 mg 3 times a day, tramadol 50 mg at night, losartan 25 mg once a day, Flomax 0.4 mg once a day, isosorbide 30 mg extended-release once a day, pravastatin 80 mg at night, Uloric 80 mg once a day, Ativan 0.5 mg at night, tiotropium 18 mcg inhaled once a day, torsemide 20 mg 2 tablets twice a day, Omega 3 fatty acid fish oil 1 gram twice a day, glucosamine 1500 mg twice a day, Prozac 20 mg once a day, trazodone 25  mg at night, aripiprazole 5 mg per day.   MENTAL STATUS EXAMINATION: Neatly dressed and groomed man who looks his stated age or younger. Cooperative with the exam. Good eye contact. Normal psychomotor activity. Speech normal rate, tone and volume. Affect euthymic, reactive, appropriate. Mood stated as good. Thoughts lucid. No loosening of associations. Denies auditory or visual hallucinations. Denies suicidal or homicidal ideation. Shows improved insight and judgment. Alert and oriented x 4. Short-and long-term memory both intact to testing.   LABORATORY RESULTS: Admission labs included. Urinalysis unremarkable. Drug screen positive for opiates. TSH normal at 1.7. Alcohol undetected. Chemistry panel: Elevated creatinine 2.34 which is chronic, glucose elevated at 119. White count elevated at 11.4, hematocrit low at 37.0, hemoglobin low 11.6.   DISPOSITION: Discharge home with family. Follow [facility].   DIAGNOSIS, PRINCIPAL AND PRIMARY:  AXIS I: Major depression single, severe with psychotic features.   SECONDARY DIAGNOSES: AXIS I: No further.  AXIS II: No diagnosis.  AXIS III: Coronary artery disease, hypertension, chronic obstructive pulmonary disease, gout, chronic prostate disease, arthritis with chronic pain.  AXIS IV: Moderate to severe from multiple illnesses.  AXIS V: Functioning at time of discharge is 55.  ____________________________ Audery AmelJohn T. Mosie Angus, MD jtc:sg D: 08/22/2013 22:39:20 ET T: 08/23/2013 09:38:57 ET JOB#: [id number]  cc: Audery AmelJohn T. Cailean Heacock, MD, <Dictator> Audery AmelJOHN T Libero Puthoff MD ELECTRONICALLY SIGNED 08/23/2013 12:15

## 2014-09-24 NOTE — H&P (Signed)
PATIENT NAME:  Calvin Tucker, MORONTA MR#:  [id number] DATE OF BIRTH:  March 08, 1924  DATE OF ADMISSION:  07/17/2013  AGE: 81  SEX: Male.  RACE: White.  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 The patient is married for 65 years, and his wife went to a facility, since she has dementia. The patient comes for first inpatient psychiatry at the [facility] with a chief complaint being depressed, being and lonely." Family brought him here for help.   HISTORY OF PRESENT ILLNESS: The patient has been married for 65 years to his wife, and ever since she moved to a facility because of dementia, he has been feeling depressed. He has been feeling low and down, and having suicidal wishes and thoughts.   PAST PSYCHIATRIC HISTORY:   No previous history of inpatient psychiatry admission.  No history of suicidal attempts. Not being followed by any psychiatrist.    FAMILY HISTORY OF MENTAL ILLNESS: None known for mental illness. No known history of suicides in the family.   FAMILY HISTORY:  Raised by parents. Father was a truck Hospital doctor. Father died of lung cancer. Mother was a homemaker. Mother died of cancer. Had two sisters, who are both dead.  PERSONAL HISTORY: Born in [place]. Graduated from high school. No college.  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  MILITARY: Has been in the [employer].  MARRIAGE: Married once, has two daughters, very close to family.  ALCOHOL AND DRUGS: Used to drink alcohol, but quit drinking in 1962. Used to smoke cigarettes and quit smoking in 1981.  MEDICAL HISTORY: Has high blood pressure. No known diabetes mellitus. Status post 3 stents put in his heart. No history of motor vehicle accident. Never been unconscious.  ALLERGIES: PREDNISONE.  Being followed by Dr. Dareen Piano at the [facility]. Last appointment was early part of  January 2015. Next appointment to be made.  PHYSICAL EXAMINATION: VITAL SIGNS: Temperature is 97.4, pulse is 72 per minute, respiratory rate 20 per minute, blood pressure 150/70 mmHg. HEENT: Head is normocephalic, atraumatic. Eyes: PERRLA. Fundi benign. Marland Kitchen NECK:  Supple. CHEST: Normal expansion. Normal breath sounds. Lungs show mild wheezes bilaterally. HEART: Normal without any murmurs or gallops. ABDOMEN: Soft. No organomegaly. Bowel sounds heard. RECTAL: Deferred. NEUROLOGIC: The patient walks with a walker. Admits that recently had bronchitis and so he feels weak, and he is using a walker for stability. Cranial nerves II through XII are grossly intact. DTRs 2+ normal.   MENTAL STATUS EXAMINATION: The patient is dressed in street clothes. Alert and oriented to place, person, time. Affect is flat and mood depressed. The patient is low and down and depressed, thinking about his wife, who has been with him for 65 years. He feels hopeless and helpless about her going to a facility. Admits feeling worthless and useless, as he is not able to help her. He did have suicidal wishes at the time of admission, but currently contracts for safety. No psychosis. Denies auditory or visual hallucinations. Denies paranoid or suspicious ideas.. Memory is intact. Cognition is intact. He could count money. He could spell the word "world." Denies any suicidal or homicidal ideas or plans and wants to get better. Insight and judgment fair.  IMPRESSION:   AXIS I: Major depressive disorder. Single episode, since his wife has been to a facility because of dementia. Alcohol abuse in remission since 1962. Nicotine dependence  in remission since 1981.   AXIS II: Deferred.  AXIS III: Hypertension, status post 3 stents put in the heart.  AXIS IV: Severe. The patient has been married for 65 years, and wife is in a facility and misses her vey much, which caused him to become depressed.  AXIS V: Global assessment of  functioning 25.  PLAN: The patient is [facility] for close observation and help. His Prozac will be increased to 20 mg for better control of depression. Will add Abilify 2 mg p.o. daily to act as adjunct  with Prozac. During the stay in hospital, we will be giving milieu therapy and supportive counseling with coping skills and dealing with loneliness and missing spouse will be addressed. At the time of discharge, the patient was stabilized and appropriate followup appointment made in the community.      ____________________________ Jannet MantisSurya K. Guss [place], MD skc:cg D: 07/18/2013 22:04:21 ET T: 07/19/2013 00:00:49 ET JOB#: [id number]  cc: Monika SalkSurya K. Guss [place], MD, <Dictator> Beau FannySURYA K Cambrea Kirt MD ELECTRONICALLY SIGNED 07/19/2013 9:21

## 2014-09-24 NOTE — H&P (Signed)
PATIENT NAME:  Calvin Tucker, Calvin Tucker MR#:  [id number] DATE OF BIRTH:  Apr 17, 1924  DATE OF ADMISSION:  02/21/2014  PRIMARY CARE PHYSICIAN:  Marya Amsler. Dareen Piano, MD   CHIEF COMPLAINT: Not feeling well with cough and chills.   HISTORY OF PRESENT ILLNESS:  Calvin Tucker is an 81 year old Caucasian gentleman with past medical history of COPD, sleep apnea, hypertension, comes in the Emergency Room after he was discharged, after he went home yesterday with a course of Levaquin for his pneumonia. The patient said through the night he had a lot of chills.  He did not feel well. He had poor p.o. intake, felt very fatigued and came to the Emergency Room where his x-ray shows bilaterally pneumonia. He is being admitted for further evaluation and management with IV antibiotics. The patient received IV Rocephin and Zithromax in the Emergency Room. He has a white count of  13.4.   PAST MEDICAL HISTORY OF: 1.  COPD. The patient is an ex-smoker.  2.  History of sleep apnea.  3.  Hypertension.  4.  Hyperlipidemia.  5.  History of erectile dysfunction.  6.  Type 2 diabetes, diet controlled.  7.  Benign prostate hypertrophy.  8.  Inguinal hernia repair.  9.  History of aortic stent.  10.  Benign breast tumor.   11.  History of chronic atrial fibrillation, not on anticoagulation due to inherited bleeding disorder.   ALLERGIES: PREDNISONE, ASPIRIN, AND ACE INHIBITORS.    MEDICATIONS:  1.  Acetaminophen 325 mg 2 tablets 3 times a day.  2.  Finasteride 5 mg p.o. daily.  3.  Fluoxetine 20 mg daily.  4.  Glucosamine 500 mg 2 tablets twice a day.  5.  Imdur 30 mg extended release p.o. daily.  6.  Levaquin 750 mg p.o. daily, which was started on September 20th in the Emergency Room. 7.  Lorazepam 0.5 mg at bedtime.  8.  Metoprolol 100 mg b.i.d.  9.  Omega-3 polyunsaturated fatty acid 1000 mg b.i.d. 10.  Tamsulosin 0.4 mg p.o. daily. 11.  Tiotropium handihaler 18 mcg inhalation daily.    12.  Tramadol 50 mg at bedtime.   13.  Trazodone 50 mg 1/2 tablet at bedtime.  14.  Uloric 80 mg p.o. daily.   SOCIAL HISTORY: Lives at home by himself.  Ex-smoker, nonalcoholic.   FAMILY HISTORY: Father died of lung cancer. Mother died of cancer, which is unknown.   REVIEW OF SYSTEMS:  CONSTITUTIONAL: Positive for chills, fatigue, weakness.  EYES: No blurred or double vision, glaucoma, or cataracts.  EARS, NOSE, THROAT: No tinnitus, ear pain, hearing loss, or postnasal drip.  RESPIRATORY: Positive for cough. Positive for COPD. No hemoptysis or dyspnea.  CARDIOVASCULAR: No chest pain, orthopnea, or edema. Positive for dyspnea on exertion.  GASTROINTESTINAL: No nausea, vomiting, diarrhea, abdominal pain, or hematemesis.  GENITOURINARY: No dysuria, hematuria, or frequency.  ENDOCRINE: No polyuria, nocturia, or thyroid problems.  HEMATOLOGY: No anemia, easy bruising or bleeding.  SKIN: No acne, rash, or lesion.  MUSCULOSKELETAL: Positive for arthritis. No swelling or gout.  NEUROLOGIC:  No CVA, TIA, ataxia, or dementia.  PSYCHIATRIC:  Positive for depression and anxiety.   All other systems reviewed and negative.   PHYSICAL EXAMINATION: GENERAL: The patient is awake, alert, oriented x3, not in acute distress.  VITAL SIGNS:  He is afebrile. Pulse is 85. Blood pressure is 112/67. Saturations are 91% to 95% on room air.  HEENT: Atraumatic, normocephalic. PERRLA.  EOM intact. Oral mucosa is moist.  NECK:  Supple. No JVD. No carotid bruit.  RESPIRATORY: Decreased breath sounds in the bases. A few bibasilar crackles heard. No respiratory distress or labored breathing.  HEART: Both the heart sounds are normal. Rate is tachycardic, rhythm is irregularly irregular. No murmur heard. PMI not lateralized. Chest nontender.  EXTREMITIES: Good pedal pulses, good femoral pulses. No lower extremity edema.  ABDOMEN: Obese, soft, nontender. No organomegaly. Positive bowel sounds.  NEUROLOGIC:   Grossly intact cranial nerves II through  XII.  No motor or sensory deficit.  PSYCHIATRIC: The patient is awake, alert, oriented x3.   IMAGING: Chest x-ray shows densities in the right mid and lower lung consistent with interstitial pneumonia superimposed upon findings of COPD.   LABORATORY DATA:  White count is 13.4, hemoglobin and hematocrit is 10.1 and 31.8. Platelet count is 188,000. Creatinine is 2.47. Baseline is 2.02. Sodium is 130, potassium is 3.9, chloride is 97. Urinalysis negative for UTI.    ASSESSMENT: [age]-year-old Calvin Tucker with history of chronic atrial fibrillation, hypertension, comes in with:  1.  Sepsis suspected due to right mid lower lobe pneumonia. The patient failed outpatient treatment, came in with chills, abnormal chest x-ray, elevated white count, and was tachycardic initially in with a heart rate in the 110s to 120s. The patient will be admitted on the medical floor. We will give him IV antibiotics with Rocephin and Zithromax. Follow blood cultures, sputum cultures. P.r.n. nebulizers if needed. Follow white count.  2.  Chronic atrial fibrillation. Initially, heart rate was elevated. Now it is stable in the 80s. The patient is on metoprolol 100 b.i.d.; however, I will hold it off at this time given his mild sepsis and his relative hypotension. We will resume prior to discharge. Blood pressure is stable. The patient is not on any anticoagulation due to inherited bleeding disorder.  3.  Benign prostatic hypertrophy. Continue tamsulosin.  4.  History of depression and anxiety. Continue fluoxetine and lorazepam as per home medications.  5.  Acute on chronic renal failure. Baseline creatinine around 2.1 to 2.3. We will give some IV fluids for hydration. Avoid nephrotoxins and monitor intake and output.   6.  Deep vein thrombosis prophylaxis. Subcutaneous heparin t.i.d.  7.  Further work-up per the patient's clinical course. Hospital admission plan was discussed with the patient and the patient's family members.   The patient is a full code.   TIME SPENT: Fifty minutes.   ____________________________ Wylie HailSona A. Allena KatzPatel, MD sap:lr D: 02/21/2014 15:46:45 ET T: 02/21/2014 16:04:57 ET JOB#: [id number]  cc: Yamil Dougher A. Allena KatzPatel, MD, <Dictator> Marya AmslerMarshall W. Dareen PianoAnderson, MD Willow OraSONA A Izaia Say MD ELECTRONICALLY SIGNED 02/24/2014 15:22

## 2014-09-24 NOTE — Op Note (Signed)
PATIENT NAME:  Calvin Tucker, Bernardino E MR#:  [id number] DATE OF BIRTH:  1924-02-01  DATE OF PROCEDURE:  05/31/2013  PREOPERATIVE DIAGNOSIS: Cataract, left eye.   POSTOPERATIVE DIAGNOSIS:  Cataract, left eye.   PROCEDURE PERFORMED: Extracapsular cataract extraction using phacoemulsification with placement of an Alcon [id number], 18.5-diopter posterior chamber lens with 5.25 diopters of cylinder, serial number [id number].   SURGEON: Maylon PeppersSteven A. Floyce Bujak, M.D.   ANESTHESIA: Lidocaine 4% and 0.75% Marcaine in a 50-50 mixture with 10 units/mL of Hylenex  added, given as a peribulbar.   ANESTHESIOLOGIST: Dr. Darleene CleaverVan Staveren.   COMPLICATIONS: None.   ESTIMATED BLOOD LOSS: Less than 1 mL.   DESCRIPTION OF PROCEDURE: Patient was brought to the Operating Room and sitting upright the eyes were both anesthetized with topical proparacaine. An ASICO toric marker was then used to mark the 3 and 9 o'clock positions on the left eye with the patient fixating at a distant target with the right eye. The patient was placed supine, given IV sedation and peribulbar block. He was then prepped and draped in the usual fashion.   The vertical rectus muscles were imbricated using 5-0 silk sutures as bridle sutures. A toric marker was brought to the table and lined up with the 3 and 9 o'clock marks. A mark at 3 degrees was made on the cornea and also at 95 degrees superiorly. Cautery was then used to cauterize the vessels at the limbus at 95 degrees and partial-thickness scleral groove was made at that place. This was dissected anteriorly into clear cornea with an Alcon crescent knife. The anterior chamber was then entered superotemporally through clear cornea with a paracentesis knife and through the lamellar dissection with a 2.6 mm keratome. DisCoVisc was used to place the aqueous. Continuous tear circular capsulorrhexis was carried out. Hydrodelineation was used to loosen the nucleus and phacoemulsification was carried out in a  divide-and-conquer technique. Total ultrasound time was 2 minutes and 2.3 seconds with an average power of 25% and a CDE of 47.07. Irrigation/aspiration was used to remove the residual cortex. The capsular bag was inflated using DisCoVisc and the intraocular lens was inserted into the capsular bag using a [employer] shooter. It was rotated to line up the marks on the haptics with the 3 degree marks. Because of the small pupil, a Kuglen hook was used to move the iris back to confirm the position of the marks on the lens relative to the marks on the eye.   Irrigation/aspiration was used to remove the residual DisCoVisc. The wound was inflated with balanced salt and Miostat was injected through the paracentesis tract. Cefuroxime 0.1 mL containing 1 mg of drug was injected via the paracentesis tract. The wound was checked for leaks, none were found. The bridle sutures were removed. Two drops of Vigamox were placed in the eye. A shield was placed on the eye and the patient was discharged to the recovery room in good condition.     ____________________________ Maylon PeppersSteven A. Riona Lahti, MD sad:cs D: 05/31/2013 13:31:27 ET T: 05/31/2013 14:28:37 ET JOB#: [id number]  cc: Viviann SpareSteven A. Kalab Camps, MD, <Dictator> Erline LevineSTEVEN A Roshawna Colclasure MD ELECTRONICALLY SIGNED 06/07/2013 11:54

## 2014-09-24 NOTE — Consult Note (Signed)
PATIENT NAME:  Calvin Tucker, Calvin Tucker MR#:  [id number] DATE OF BIRTH:  10/26/1923  DATE OF CONSULTATION:  07/17/2013  CONSULTING PHYSICIAN:  Viaan Knippenberg K. Taleigh Gero, MD  AGE: 81 years. SEX: Male. RACE: White.  SUBJECTIVE: The patient is an 81 year old white male, retired after working for many years and was married for 64 years, and currently his wife has dementia and has to go to a structured place. The patient reports that he has been feeling depressed and lonely since his wife left him. She has dementia.  PAST PSYCHIATRIC HISTORY: No previous history of inpatient hold psychiatry. No history of suicide attempts.   ALCOHOL AND DRUGS: Denied.  MENTAL STATUS EXAMINATION: The patient is seen lying comfortably. Alert and oriented. Calm, pleasant, and cooperative. He knew the situation that brought him to the hospital. Affect is flat. Mood depressed. Admits feeling hopeless and helpless. Admits feeling low and down and worthless about himself, and not being able to see his wife and be with his wife makes him feel very low and down and in fact has suicidal wishes and thoughts, but contracts for safety. No psychosis. . Cognition intact. General knowledge of information fair. Insight and judgment guarded.  IMPRESSION: Major depression with suicidal ideas.  RECOMMEND: Inpatient hospitalization to psychiatry for further help.    ____________________________ Jannet MantisSurya K. Guss Bundehalla, MD skc:jcm D: 07/17/2013 20:42:00 ET T: 07/17/2013 21:30:12 ET JOB#: [id number]  cc: Monika SalkSurya K. Guss Bundehalla, MD, <Dictator> Beau FannySURYA K Leyan Branden MD ELECTRONICALLY SIGNED 07/18/2013 20:34

## 2014-10-28 DIAGNOSIS — R110 Nausea: Secondary | ICD-10-CM

## 2014-10-28 DIAGNOSIS — K219 Gastro-esophageal reflux disease without esophagitis: Secondary | ICD-10-CM

## 2014-11-08 DIAGNOSIS — I270 Primary pulmonary hypertension: Secondary | ICD-10-CM

## 2014-11-08 DIAGNOSIS — R110 Nausea: Secondary | ICD-10-CM

## 2014-11-08 DIAGNOSIS — I482 Chronic atrial fibrillation: Secondary | ICD-10-CM

## 2014-11-08 DIAGNOSIS — N184 Chronic kidney disease, stage 4 (severe): Secondary | ICD-10-CM

## 2014-11-08 DIAGNOSIS — E1129 Type 2 diabetes mellitus with other diabetic kidney complication: Secondary | ICD-10-CM

## 2014-11-08 DIAGNOSIS — J449 Chronic obstructive pulmonary disease, unspecified: Secondary | ICD-10-CM

## 2014-11-08 DIAGNOSIS — F322 Major depressive disorder, single episode, severe without psychotic features: Secondary | ICD-10-CM

## 2014-11-14 ENCOUNTER — Ambulatory Visit: Payer: Medicare Other | Admitting: Psychiatry

## 2014-11-16 DIAGNOSIS — M79605 Pain in left leg: Secondary | ICD-10-CM

## 2014-12-03 ENCOUNTER — Emergency Department
Admission: EM | Admit: 2014-12-03 | Discharge: 2014-12-03 | Disposition: A | Discharge status: Home / Self Care | Attending: Emergency Medicine | Admitting: Emergency Medicine

## 2014-12-03 ENCOUNTER — Encounter: Payer: Self-pay | Admitting: Emergency Medicine

## 2014-12-03 ENCOUNTER — Emergency Department

## 2014-12-03 DIAGNOSIS — Z23 Encounter for immunization: Secondary | ICD-10-CM | POA: Insufficient documentation

## 2014-12-03 DIAGNOSIS — Y92129 Unspecified place in nursing home as the place of occurrence of the external cause: Secondary | ICD-10-CM | POA: Diagnosis not present

## 2014-12-03 DIAGNOSIS — Y998 Other external cause status: Secondary | ICD-10-CM | POA: Insufficient documentation

## 2014-12-03 DIAGNOSIS — Z87891 Personal history of nicotine dependence: Secondary | ICD-10-CM | POA: Insufficient documentation

## 2014-12-03 DIAGNOSIS — S0181XA Laceration without foreign body of other part of head, initial encounter: Secondary | ICD-10-CM | POA: Insufficient documentation

## 2014-12-03 DIAGNOSIS — Z79899 Other long term (current) drug therapy: Secondary | ICD-10-CM | POA: Insufficient documentation

## 2014-12-03 DIAGNOSIS — W01198A Fall on same level from slipping, tripping and stumbling with subsequent striking against other object, initial encounter: Secondary | ICD-10-CM | POA: Diagnosis not present

## 2014-12-03 DIAGNOSIS — S0990XA Unspecified injury of head, initial encounter: Secondary | ICD-10-CM | POA: Diagnosis present

## 2014-12-03 DIAGNOSIS — Y9389 Activity, other specified: Secondary | ICD-10-CM | POA: Insufficient documentation

## 2014-12-03 DIAGNOSIS — S0012XA Contusion of left eyelid and periocular area, initial encounter: Secondary | ICD-10-CM

## 2014-12-03 HISTORY — DX: Pneumonia, unspecified organism: J18.9

## 2014-12-03 HISTORY — DX: Insomnia, unspecified: G47.00

## 2014-12-03 HISTORY — DX: Chronic obstructive pulmonary disease, unspecified: J44.9

## 2014-12-03 MED ORDER — TETANUS-DIPHTHERIA TOXOIDS TD 5-2 LFU IM INJ
INJECTION | INTRAMUSCULAR | Status: AC
  Administered 2014-12-03: 0.5 mL via INTRAMUSCULAR
  Filled 2014-12-03: qty 0.5

## 2014-12-03 MED ORDER — BACITRACIN 500 UNIT/GM EX OINT
1.0000 "application " | TOPICAL_OINTMENT | Freq: Two times a day (BID) | CUTANEOUS | Status: DC
  Administered 2014-12-03: 1 via TOPICAL
  Filled 2014-12-03 (×2): qty 0.9

## 2014-12-03 MED ORDER — TETANUS-DIPHTHERIA TOXOIDS TD 5-2 LFU IM INJ
0.5000 mL | INJECTION | Freq: Once | INTRAMUSCULAR | Status: AC
  Administered 2014-12-03: 0.5 mL via INTRAMUSCULAR

## 2014-12-03 MED ORDER — BACITRACIN ZINC 500 UNIT/GM EX OINT
TOPICAL_OINTMENT | CUTANEOUS | Status: AC
  Administered 2014-12-03: 1 via TOPICAL
  Filled 2014-12-03: qty 0.9

## 2014-12-03 NOTE — ED Notes (Signed)
Report to ally, rn.  

## 2014-12-03 NOTE — ED Provider Notes (Signed)
[facility] Emergency Department Provider Note  ____________________________________________  Time seen: 5:45AM  I have reviewed the triage vital signs and the nursing notes.   HISTORY  Chief Complaint Fall; Facial Laceration; and Facial Injury     HPI Calvin Tucker is a 81 y.o. male presents with witnessed fall from [facility] [place] [facility]. Patient was apparently at the nursing station and accidentally slipped and fell striking his chin. Patient denies any loss of consciousness.    Past Medical History  Diagnosis Date  . Pneumonia   . COPD (chronic obstructive pulmonary disease)   . Insomnia     There are no active problems to display for this patient.   History reviewed. No pertinent past surgical history.  Current Outpatient Rx  Name  Route  Sig  Dispense  Refill  . acetaminophen (TYLENOL) 325 MG tablet   Oral   Take 650 mg by mouth every 4 (four) hours as needed for mild pain.         . bumetanide (BUMEX) 1 MG tablet   Oral   Take 3 mg by mouth 2 (two) times daily.         Marland Kitchen diltiazem (CARDIZEM CD) 240 MG 24 hr capsule   Oral   Take 240 mg by mouth daily.         . febuxostat (ULORIC) 40 MG tablet   Oral   Take 80 mg by mouth daily.         . fexofenadine (ALLEGRA) 60 MG tablet   Oral   Take 60 mg by mouth daily as needed for allergies or rhinitis (daily prn for itching).         . finasteride (PROSCAR) 5 MG tablet   Oral   Take 5 mg by mouth daily.         Marland Kitchen FLUoxetine (PROZAC) 40 MG capsule   Oral   Take 40 mg by mouth daily.         Marland Kitchen gabapentin (NEURONTIN) 100 MG capsule   Oral   Take 100 mg by mouth 3 (three) times daily.         Marland Kitchen HYDROcodone-acetaminophen (NORCO/VICODIN) 5-325 MG per tablet   Oral   Take 1 tablet by mouth every 4 (four) hours as needed for moderate pain (take 0.5 to 1 tab every 4 hours prn pain).         Marland Kitchen ipratropium-albuterol (DUONEB) 0.5-2.5 (3) MG/3ML SOLN    Nebulization   Take 3 mLs by nebulization every 4 (four) hours as needed.         Marland Kitchen LORazepam (ATIVAN) 0.5 MG tablet   Oral   Take 0.5 mg by mouth 2 (two) times daily.         . magnesium hydroxide (MILK OF MAGNESIA) 400 MG/5ML suspension   Oral   Take by mouth 2 (two) times daily as needed for mild constipation.         . metolazone (ZAROXOLYN) 2.5 MG tablet   Oral   Take 2.5 mg by mouth daily.         . metolazone (ZAROXOLYN) 2.5 MG tablet   Oral   Take 2.5 mg by mouth daily as needed (if needed if weight greater than 200 pounds).         . metoprolol succinate (TOPROL-XL) 100 MG 24 hr tablet   Oral   Take 100 mg by mouth daily. Take with or immediately following a meal.         .  mirtazapine (REMERON) 15 MG tablet   Oral   Take 7.5 mg by mouth at bedtime.         Marland Kitchen omeprazole (PRILOSEC) 20 MG capsule   Oral   Take 20 mg by mouth daily.         . phenylephrine-shark liver oil-mineral oil-petrolatum (PREPARATION H) 0.25-3-14-71.9 % rectal ointment   Rectal   Place 1 application rectally 4 (four) times daily as needed for hemorrhoids.         . polyethylene glycol (MIRALAX / GLYCOLAX) packet   Oral   Take 17 g by mouth daily as needed.         . potassium chloride SA (K-DUR,KLOR-CON) 20 MEQ tablet   Oral   Take 20 mEq by mouth 2 (two) times daily.         . promethazine (PHENERGAN) 25 MG tablet   Oral   Take 25 mg by mouth every 6 (six) hours as needed for nausea or vomiting (take one tab every 4-6 hours prn nausea).         . senna (SENOKOT) 8.6 MG tablet   Oral   Take 1 tablet by mouth daily as needed for constipation.         . sennosides-docusate sodium (SENOKOT-S) 8.6-50 MG tablet   Oral   Take 2 tablets by mouth daily.         . tamsulosin (FLOMAX) 0.4 MG CAPS capsule   Oral   Take 0.4 mg by mouth daily.         Marland Kitchen tiotropium (SPIRIVA) 18 MCG inhalation capsule   Inhalation   Place 18 mcg into inhaler and inhale daily.          . traMADol (ULTRAM) 50 MG tablet   Oral   Take 100 mg by mouth at bedtime.         . traMADol (ULTRAM) 50 MG tablet   Oral   Take 50 mg by mouth every 8 (eight) hours as needed (take 1-2 tabs q8 prn pain).           Allergies Ace inhibitors; Advair diskus; Aspirin; and Prednisone  History reviewed. No pertinent family history.  Social History History  Substance Use Topics  . Smoking status: Former Games developer  . Smokeless tobacco: Never Used  . Alcohol Use: No    Review of Systems  Constitutional: Negative for fever. Eyes: Negative for visual changes. Positive for Left periorbital bruise ENT: Negative for sore throat. Cardiovascular: Negative for chest pain. Respiratory: Negative for shortness of breath. Gastrointestinal: Negative for abdominal pain, vomiting and diarrhea. Genitourinary: Negative for dysuria. Musculoskeletal: Negative for back pain. Skin: Negative for rash. Neurological: Negative for headaches, focal weakness or numbness.  10-point ROS otherwise negative.  ____________________________________________   PHYSICAL EXAM:  VITAL SIGNS: ED Triage Vitals  Enc Vitals Group     BP 12/03/14 0459 120/51 mmHg     Pulse Rate 12/03/14 0459 65     Resp 12/03/14 0459 20     Temp 12/03/14 0459 99.2 F (37.3 C)     Temp Source 12/03/14 0459 Oral     SpO2 12/03/14 0459 94 %     Weight 12/03/14 0459 185 lb (83.915 kg)     Height 12/03/14 0459  (1.854 m)     Head Cir --      Peak Flow --      Pain Score 12/03/14 0500 5     Pain Loc --  Pain Edu? --      Excl. in GC? --      Constitutional: Alert and oriented. Well appearing and in no distress. Eyes: Conjunctivae are normal. PERRL. Normal extraocular movements. ENT   Head: Normocephalic and atraumatic. 4 cm inferior linear chin laceration   Nose: No congestion/rhinnorhea.   Mouth/Throat: Mucous membranes are moist.   Neck: No stridor. Hematological/Lymphatic/Immunilogical:  No cervical lymphadenopathy. Cardiovascular: Normal rate, regular rhythm. Normal and symmetric distal pulses are present in all extremities. No murmurs, rubs, or gallops. Respiratory: Normal respiratory effort without tachypnea nor retractions. Breath sounds are clear and equal bilaterally. No wheezes/rales/rhonchi. Gastrointestinal: Soft and nontender. No distention. There is no CVA tenderness. Genitourinary: deferred Musculoskeletal: Nontender with normal range of motion in all extremities. No joint effusions.  No lower extremity tenderness nor edema. Neurologic:  Normal speech and language. No gross focal neurologic deficits are appreciated. Speech is normal.  Skin:  Skin is warm, dry and intact. No rash noted. Psychiatric: Mood and affect are normal. Speech and behavior are normal. Patient exhibits appropriate insight and judgment.  ____________________________________________       RADIOLOGY  CAT scan of the head and maxillofacial revealed: IMPRESSION: CT HEAD:  1. No acute intracranial process. 2. Small left frontotemporal scalp contusion. 3. Generalized age-related cerebral atrophy with chronic microvascular ischemic disease.  CT MAXILLOFACIAL:  1. No acute maxillofacial fracture. 2. Small left periorbital contusion. Intact globes with no retro-orbital pathology.  INITIAL IMPRESSION / ASSESSMENT AND PLAN / ED COURSE  Pertinent labs & imaging results that were available during my care of the patient were reviewed by me and considered in my medical decision making (see chart for details).    ____________________________________________   FINAL CLINICAL IMPRESSION(S) / ED DIAGNOSES  Final diagnoses:  Black eye of left side  Chin laceration, initial encounter      Darci Currentandolph N Elza Sortor, MD 12/06/14 [phone]

## 2014-12-03 NOTE — ED Notes (Signed)
Pt assisted with urination.  

## 2014-12-03 NOTE — Discharge Instructions (Signed)
Eye Contusion An eye contusion is a deep bruise of the eye. This is often called a "black eye." Contusions are the result of an injury that caused bleeding under the skin. The contusion may turn blue, purple, or yellow. Minor injuries will give you a painless contusion, but more severe contusions may stay painful and swollen for a few weeks. If the eye contusion only involves the eyelids and tissues around the eye, the injured area will get better within a few days to weeks. However, eye contusions can be serious and affect the eyeball and sight. CAUSES   Blunt injury or trauma to the face or eye area.  A forehead injury that causes the blood under the skin to work its way down to the eyelids.  Rubbing the eyes due to irritation. SYMPTOMS   Swelling and redness around the eye.  Bruising around the eye.  Tenderness, soreness, or pain around the eye.  Blurry vision.  Tearing.  Eyeball redness. DIAGNOSIS  A diagnosis is usually based on a thorough exam of the eye and surrounding area. The eye must be looked at carefully to make sure it is not injured and to make sure nothing else will threaten your vision. A vision test may be done. An X-ray or computed tomography (CT) scan may be needed to determine if there are any associated injuries, such as broken bones (fractures). TREATMENT  If there is an injury to the eye, treatment will be determined by the nature of the injury. HOME CARE INSTRUCTIONS   Put ice on the injured area.  Put ice in a plastic bag.  Place a towel between your skin and the bag.  Leave the ice on for 15-20 minutes, 03-04 times a day.  If it is determined that there is no injury to the eye, you may continue normal activities.  Sunglasses may be worn to protect your eyes from bright light if light is uncomfortable.  Sleep with your head elevated. You can put an extra pillow under your head. This may help with discomfort.  Only take over-the-counter or  prescription medicines for pain, discomfort, or fever as directed by your caregiver. Do not take aspirin for the first few days. This may increase bruising. SEEK IMMEDIATE MEDICAL CARE IF:   You have any form of vision loss.  You have double vision.  You feel nauseous.  You feel dizzy, sleepy, or like you will faint.  You have any fluid discharge from the eye or your nose.  You have swelling and discoloration that does not fade. MAKE SURE YOU:   Understand these instructions.  Will watch your condition.  Will get help right away if you are not doing well or get worse. Document Released: 05/17/2000 Document Revised: 08/12/2011 Document Reviewed: 04/05/2011 [facility] Patient Information 2015 [place], [place]. This information is not intended to replace advice given to you by your health care provider. Make sure you discuss any questions you have with your health care provider.  Facial Laceration  A facial laceration is a cut on the face. These injuries can be painful and cause bleeding. Lacerations usually heal quickly, but they need special care to reduce scarring. DIAGNOSIS  Your health care provider will take a medical history, ask for details about how the injury occurred, and examine the wound to determine how deep the cut is. TREATMENT  Some facial lacerations may not require closure. Others may not be able to be closed because of an increased risk of infection. The risk of infection  and the chance for successful closure will depend on various factors, including the amount of time since the injury occurred. The wound may be cleaned to help prevent infection. If closure is appropriate, pain medicines may be given if needed. Your health care provider will use stitches (sutures), wound glue (adhesive), or skin adhesive strips to repair the laceration. These tools bring the skin edges together to allow for faster healing and a better cosmetic outcome. If needed, you may also be given a  tetanus shot. HOME CARE INSTRUCTIONS  Only take over-the-counter or prescription medicines as directed by your health care provider.  Follow your health care provider's instructions for wound care. These instructions will vary depending on the technique used for closing the wound. For Sutures:  Keep the wound clean and dry.   If you were given a bandage (dressing), you should change it at least once a day. Also change the dressing if it becomes wet or dirty, or as directed by your health care provider.   Wash the wound with soap and water 2 times a day. Rinse the wound off with water to remove all soap. Pat the wound dry with a clean towel.   After cleaning, apply a thin layer of the antibiotic ointment recommended by your health care provider. This will help prevent infection and keep the dressing from sticking.   You may shower as usual after the first 24 hours. Do not soak the wound in water until the sutures are removed.   Get your sutures removed as directed by your health care provider. With facial lacerations, sutures should usually be taken out after 4-5 days to avoid stitch marks.   Wait a few days after your sutures are removed before applying any makeup. For Skin Adhesive Strips:  Keep the wound clean and dry.   Do not get the skin adhesive strips wet. You may bathe carefully, using caution to keep the wound dry.   If the wound gets wet, pat it dry with a clean towel.   Skin adhesive strips will fall off on their own. You may trim the strips as the wound heals. Do not remove skin adhesive strips that are still stuck to the wound. They will fall off in time.  For Wound Adhesive:  You may briefly wet your wound in the shower or bath. Do not soak or scrub the wound. Do not swim. Avoid periods of heavy sweating until the skin adhesive has fallen off on its own. After showering or bathing, gently pat the wound dry with a clean towel.   Do not apply liquid medicine,  cream medicine, ointment medicine, or makeup to your wound while the skin adhesive is in place. This may loosen the film before your wound is healed.   If a dressing is placed over the wound, be careful not to apply tape directly over the skin adhesive. This may cause the adhesive to be pulled off before the wound is healed.   Avoid prolonged exposure to sunlight or tanning lamps while the skin adhesive is in place.  The skin adhesive will usually remain in place for 5-10 days, then naturally fall off the skin. Do not pick at the adhesive film.  After Healing: Once the wound has healed, cover the wound with sunscreen during the day for 1 full year. This can help minimize scarring. Exposure to ultraviolet light in the first year will darken the scar. It can take 1-2 years for the scar to lose its redness and  to heal completely.  SEEK IMMEDIATE MEDICAL CARE IF:  You have redness, pain, or swelling around the wound.   You see ayellowish-white fluid (pus) coming from the wound.   You have chills or a fever.  MAKE SURE YOU:  Understand these instructions.  Will watch your condition.  Will get help right away if you are not doing well or get worse. Document Released: 06/27/2004 Document Revised: 03/10/2013 Document Reviewed: 12/31/2012 [facility] Patient Information 2015 [place]. This information is not intended to replace advice given to you by your health care provider. Make sure you discuss any questions you have with your health care provider. Please have sutures removed in 7 days

## 2014-12-03 NOTE — ED Provider Notes (Signed)
  Physical Exam  BP 127/62 mmHg  Pulse 62  Temp(Src) 99.2 F (37.3 C) (Oral)  Resp 22  Ht 6\' 1"  (1.854 m)  Wt 185 lb (83.915 kg)  BMI 24.41 kg/m2  SpO2 93%  ----------------------------------------- 7:51 AM on 12/03/2014 -----------------------------------------   Physical Exam Patient is resting comfortably. Has a 3-1/2 cm laceration to the left inferior chin. It is through to the subcutaneous layers. There is no surrounding pus, induration or active bleeding.  LACERATION REPAIR Performed by: Arelia LongestSchaevitz,  Kathleene Bergemann M Authorized by: Arelia LongestSchaevitz,  Elysse Polidore M Consent: Verbal consent obtained. Risks and benefits: risks, benefits and alternatives were discussed Consent given by: patient Patient identity confirmed: provided demographic data Prepped and Draped in normal sterile fashion Wound explored  Laceration Location: Left-sided inferior chin  Laceration Length: 3.5 cm  No Foreign Bodies seen or palpated  Irrigation method: syringe Amount of cleaning: standard  Skin closure: With 4-0 Vicryl. Closed skin with one subcutaneous stitch and 2 superficial stitches   Number of sutures: 3 total  Technique: Simple interrupted   Patient tolerance: Patient tolerated the procedure well with no immediate complications. Good approximation and cosmesis achieved. Covered with bacitracin and Steri-Strips. ED Course  Procedures  We will update tetanus shot. History was of a mechanical fall. We'll discharge to home.       Myrna Blazeravid Matthew Saadiya Wilfong, MD 12/03/14 [phone]

## 2014-12-03 NOTE — ED Notes (Signed)
Pt from [facility], pt with witnessed fall. Per ems pt was at RN station, when he slipped and fell, no loc per ems or pt. Pt with 2 inch linearr laceraton to chin, left periorbital hematoma noted. Pt denies neck pain. perrl 3mm and brisk.

## 2014-12-06 ENCOUNTER — Telehealth: Payer: Self-pay

## 2014-12-06 NOTE — Telephone Encounter (Signed)
Will follow up at [facility] today

## 2014-12-06 NOTE — Telephone Encounter (Signed)
PLEASE NOTE: All timestamps contained within this report are represented as [place] Standard Time. CONFIDENTIALTY NOTICE: This fax transmission is intended only for the addressee. It contains information that is legally privileged, confidential or otherwise protected from use or disclosure. If you are not the intended recipient, you are strictly prohibited from reviewing, disclosing, copying using or disseminating any of this information or taking any action in reliance on or regarding this information. If you have received this fax in error, please notify us immediately by telephone so that we can arrange for its return to us. Phone: [phone], Toll-Free: [phone], Fax: [phone] Page: 1 of 1 Call Id: [id number] [place] [facility] - Client TELEPHONE ADVICE RECORD [facility] Patient Name: Calvin Tucker Gender: Male DOB: Aug 01, 1923 Age: [age] Y 11 M 8 D Return Phone Number: [phone] (Primary) Address: City/State/Zip: [place] Client [place] [facility] Physician Tillman AbideLetvak, Richard Contact Type Call Call Type Page Only Caller Name Calvin Tucker Relationship To Patient Other Is this call to report lab results? No Return Phone Number [phone] (Primary) Initial Comment Charyl BiggerElizabeth Mitchell, RN at [facility]. Pt. has fallen and has a 2 inch laceration below his chin, and and a half inch one above his eye. BP 160/120 and oxygen is at 85. She needs orders to send to the ER. Call back # is [phone]. Nurse Assessment Guidelines Guideline Title Affirmed Question Affirmed Notes Nurse Date/Time (Eastern Time) Disp. Time [name](Eastern Time) Disposition Final User 12/03/2014 4:16:42 AM Called On-Call Provider Antonieta IbaHodge, Shannon 12/03/2014 4:18:05 AM Page Completed Yes Antonieta IbaHodge, Shannon After Care Instructions Given Call Event Type User Date / Time  Description Paging DoctorName Phone DateTime Result/Outcome Message Type Notes Tana ConchHunter, Stephen [phone] 12/03/2014 4:16:42 AM Called On Call Provider - Reached Doctor Paged Tana ConchHunter, Stephen 12/03/2014 4:16:45 AM Spoke with On Call - General Message Result

## 2014-12-08 DIAGNOSIS — I482 Chronic atrial fibrillation: Secondary | ICD-10-CM | POA: Diagnosis not present

## 2014-12-08 DIAGNOSIS — I2510 Atherosclerotic heart disease of native coronary artery without angina pectoris: Secondary | ICD-10-CM | POA: Diagnosis not present

## 2014-12-08 DIAGNOSIS — I495 Sick sinus syndrome: Secondary | ICD-10-CM | POA: Diagnosis not present

## 2014-12-08 DIAGNOSIS — I340 Nonrheumatic mitral (valve) insufficiency: Secondary | ICD-10-CM | POA: Diagnosis not present

## 2015-01-06 ENCOUNTER — Telehealth: Payer: Self-pay

## 2015-02-02 NOTE — Telephone Encounter (Signed)
He had been on hospice care

## 2015-02-02 NOTE — Telephone Encounter (Signed)
PLEASE NOTE: All timestamps contained within this report are represented as [place] Standard Time. CONFIDENTIALTY NOTICE: This fax transmission is intended only for the addressee. It contains information that is legally privileged, confidential or otherwise protected from use or disclosure. If you are not the intended recipient, you are strictly prohibited from reviewing, disclosing, copying using or disseminating any of this information or taking any action in reliance on or regarding this information. If you have received this fax in error, please notify us immediately by telephone so that we can arrange for its return to [place]. Phone: [phone], Toll-Free: [phone], Fax: [phone] Page: 1 of 1 Call Id: [id number] [place] [facility] Night - Client TELEPHONE ADVICE RECORD [facility] Call Center Patient Name: Calvin Tucker Gender: Male DOB: 22-Oct-1923 Age: 81 Y 12 D Return Phone Number: [phone] (Primary) Address: City/State/Zip: [place] Client [place] [facility] Night - Client Client Site [facility] [place] - Night Physician Tillman Abide Contact Type Call Call Type Page Only Caller Name Dewayne Hatch Relationship To Patient Care Giver Is this call to report lab results? No Return Phone Number [phone] (Primary) Initial Comment Caller states she is Ann at [facility] , Patient is slumped over and blood pressure went down and his lungs are wet. he is DNR he won't be able to swallow anything but is there anything she can give him. CB# [phone] Nurse Assessment Guidelines Guideline Title Affirmed Question Affirmed Notes Nurse Date/Time [name] Time) Disp. Time [name] Time) Disposition Final User 01/05/2015 10:26:15 PM Send to [facility] Paging Jamesetta Orleans, Elonda Husky 01/05/2015 10:26:24 PM Send to [facility] Paging Jamesetta Orleans, Cassandra 01/05/2015 10:31:52 PM Paged On Call back to Call Center - PC Tory Emerald 01/05/2015 10:42:05 PM Page Completed  Yes Tory Emerald After Care Instructions Given Call Event Type User Date / Time Description Paging DoctorName Phone DateTime Result/Outcome Message Type Notes Rene Paci [phone] 01/05/2015 10:31:52 PM Paged On Call Back to Call Center Doctor Paged Message from Powell at the call center. Please call me at [phone]. Rene Paci 01/05/2015 10:41:41 PM Spoke with On Call - General Message Result Spoke to on call and provided page information. Connected on call to caller

## 2015-02-02 NOTE — Telephone Encounter (Signed)
PLEASE NOTE: All timestamps contained within this report are represented as [place] Standard Time. CONFIDENTIALTY NOTICE: This fax transmission is intended only for the addressee. It contains information that is legally privileged, confidential or otherwise protected from use or disclosure. If you are not the intended recipient, you are strictly prohibited from reviewing, disclosing, copying using or disseminating any of this information or taking any action in reliance on or regarding this information. If you have received this fax in error, please notify us immediately by telephone so that we can arrange for its return to [place]. Phone: [phone], Toll-Free: [phone], Fax: [phone] Page: 1 of 1 Call Id: [id number] [place] [facility] Night - Client TELEPHONE ADVICE RECORD [facility] Call Center Patient Name: Calvin Tucker Gender: Male DOB: [birth year] Age: 81 Y 12 D Return Phone Number: [phone] (Primary) Address: City/State/Zip: [employer] Client [place] [facility] Night - Client Client Site  [facility] [place] - Night Physician Tillman Abide Contact Type Call Call Type Page Only [facility] Relationship To Patient Provider Is this call to report lab results? No Return Phone Number [phone] (Primary) Initial Comment Caller states she is Lanora Manis at [facility]. Patient just passed away and she needs an order to release the body to the funeral home. CB# [phone] Nurse Assessment Guidelines Guideline Title Affirmed Question Affirmed Notes Nurse Date/Time [name] Time) Disp. Time [name] Time) Disposition Final User 01/23/15 4:37:05 AM Paged On Call back to Call Center - PC Dorene Sorrow 23-Jan-2015 4:53:59 AM [facility] Provider Antonieta Iba 2015/01/23 4:55:22 AM Page Completed Yes Antonieta Iba After Care Instructions Given Call Event Type User Date / Time  Description Paging DoctorName Phone DateTime Result/Outcome Message Type Notes Rene Paci [phone] 01/23/15 4:37:05 AM Paged On Call Back to Call Center Doctor Paged Please call [facility] @ [phone] Rene Paci [phone] January 23, 2015 4:53:59 AM Called On Call Provider - Reached Doctor Paged Rene Paci 01-23-2015 4:54:02 AM Spoke with On Call - General Message Result

## 2015-02-02 DEATH — deceased

## 2016-04-15 IMAGING — CR DG CHEST 2V
1 series · 3 of 3 positions shown · non-contrast
Comparison: 02/20/2014

CLINICAL DATA: Pneumonia and cough.

EXAM:
CHEST - 2 VIEW

[Series 1: dxr chest pa (or ap) and lateral · 0.14mm/px · 3 of 3 slices shown]
[im 1/3]
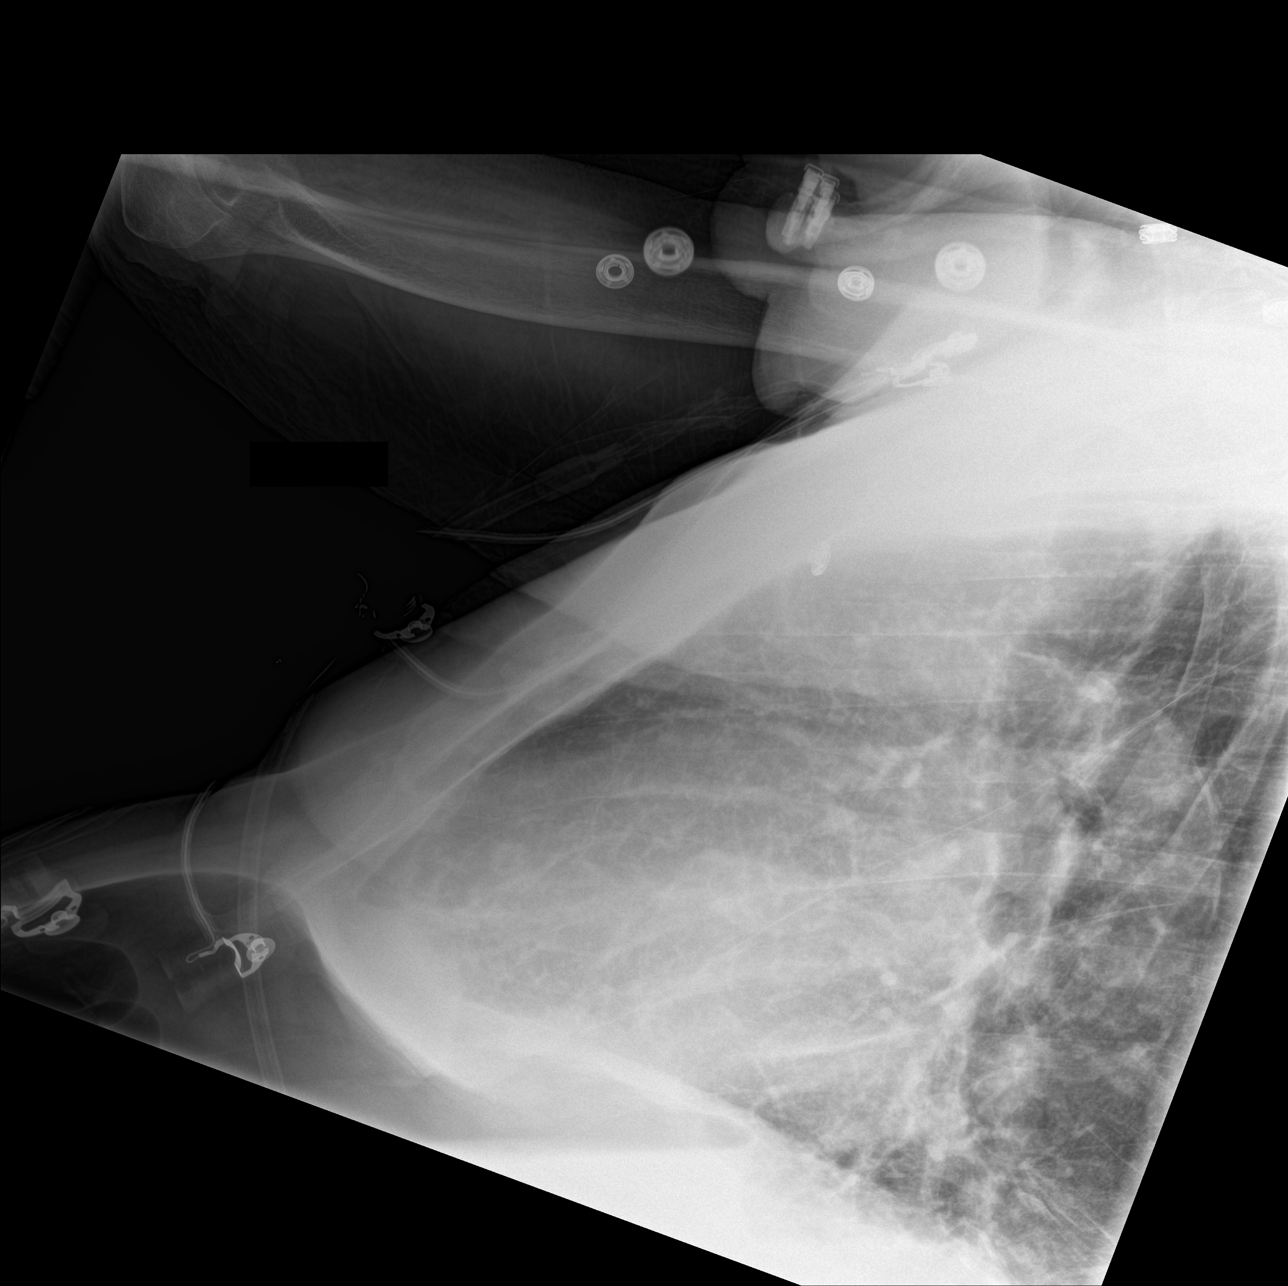
[im 2/3]
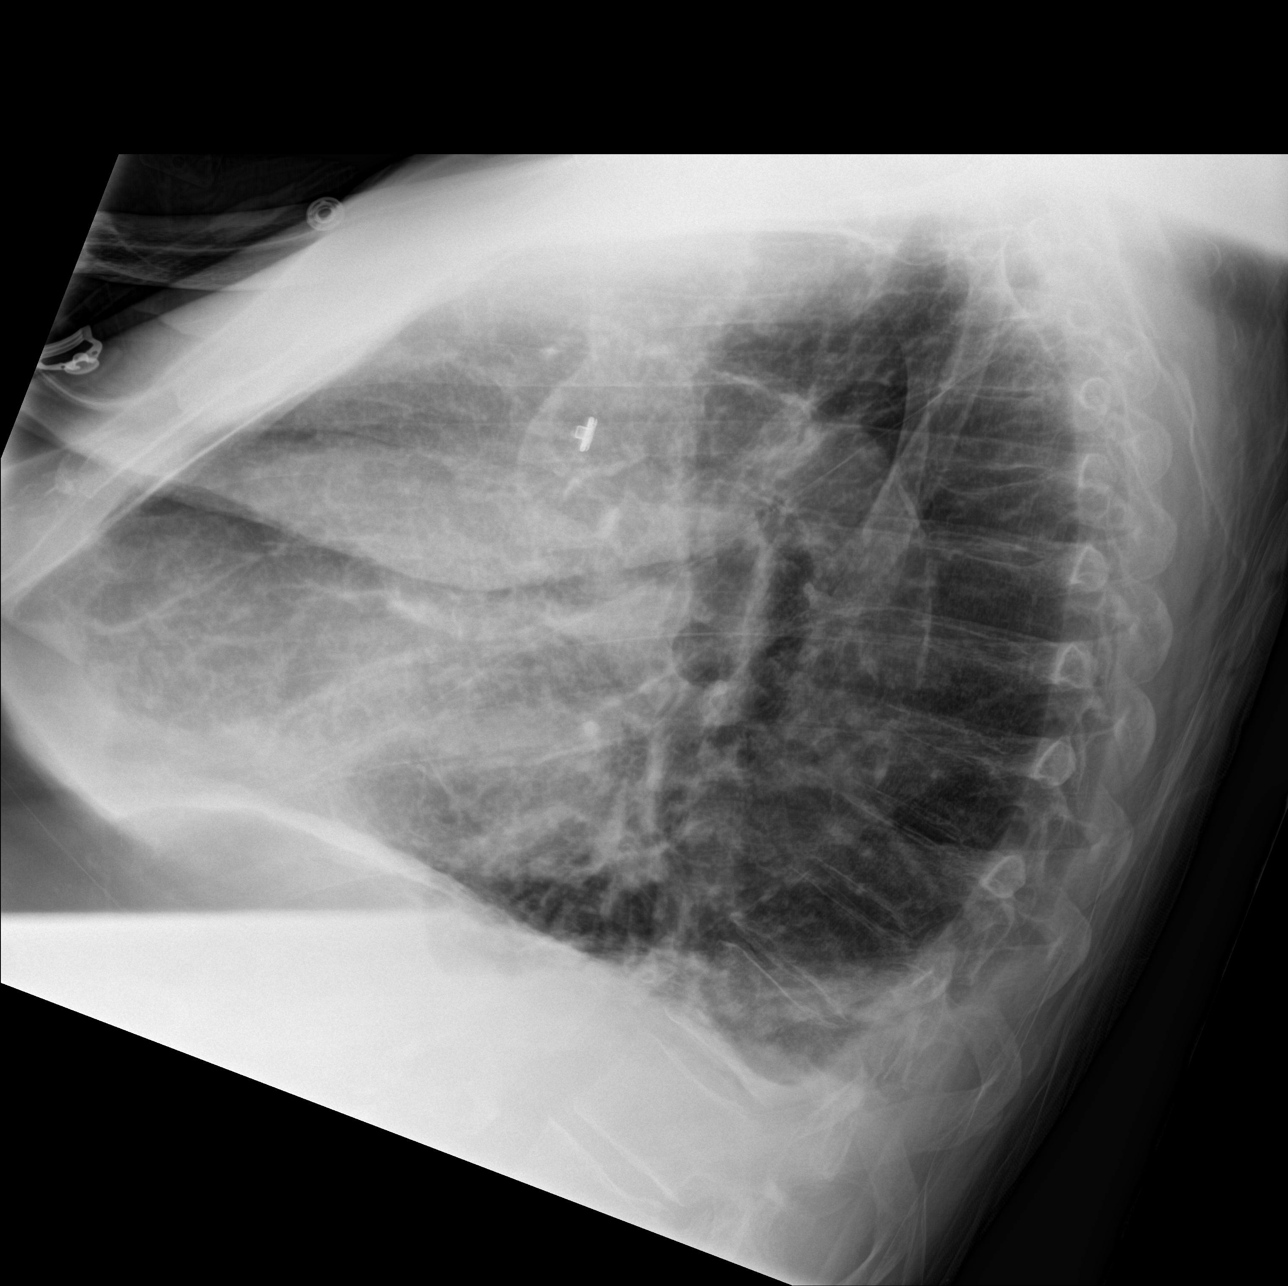
[im 3/3]
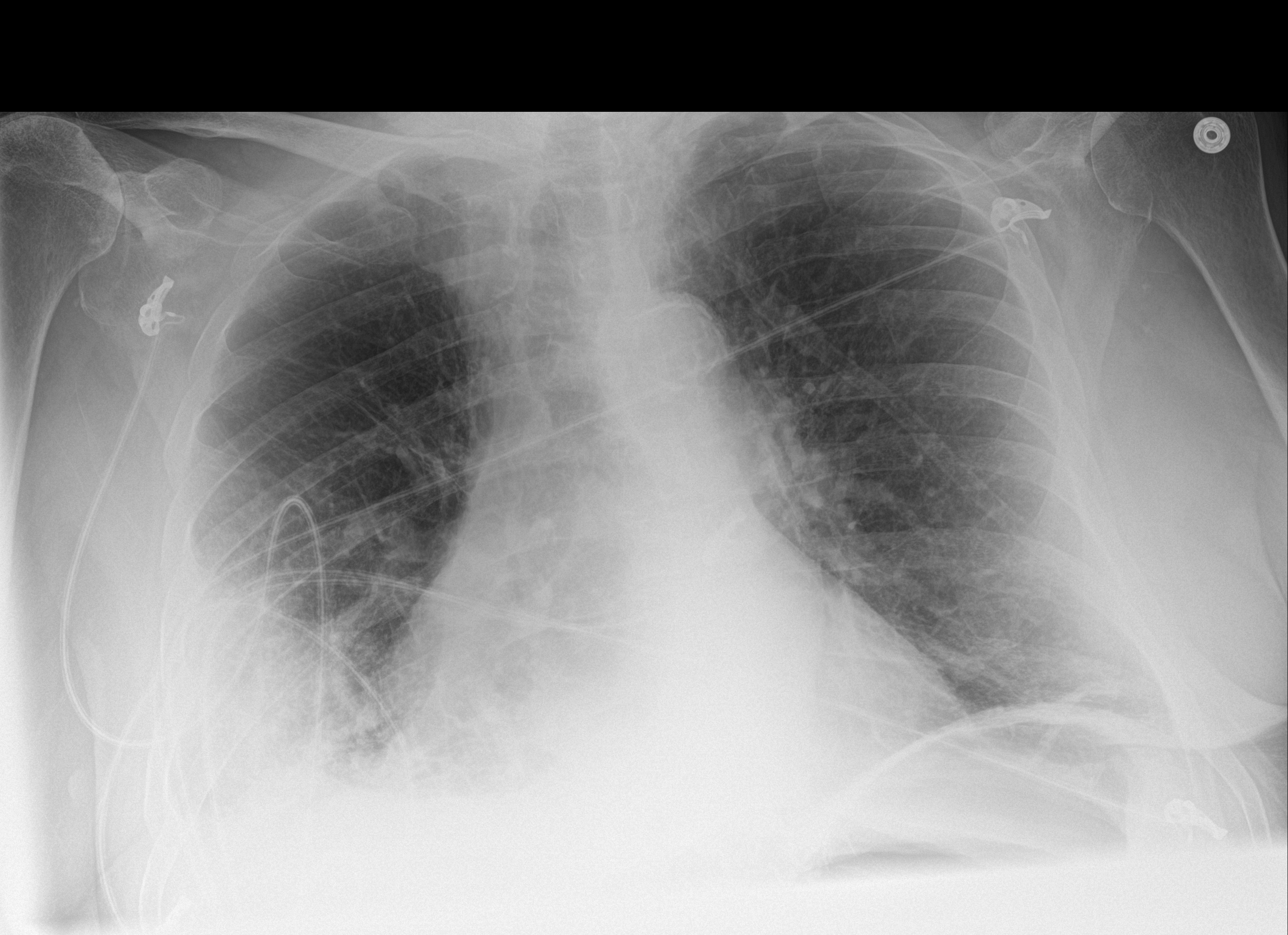

[3 of 3 positions shown; findings below may reference images not displayed]

FINDINGS: There remains infiltrate in the right lower lung which appears
slightly more dense. No edema or pleural fluid identified. Stable
top-normal heart size. No pneumothorax. Stable COPD.
IMPRESSION: Slightly increased density of right lower lung pneumonia.

## 2016-04-20 IMAGING — CR DG CHEST 2V
1 series · 3 of 3 positions shown · non-contrast
Comparison: 02/23/2014; 02/20/2014

CLINICAL DATA: f/u pneumonia

EXAM:
CHEST  2 VIEW

[Series 4: x chest ap · 0.14mm/px · 3 of 3 slices shown]
[im 1/3]
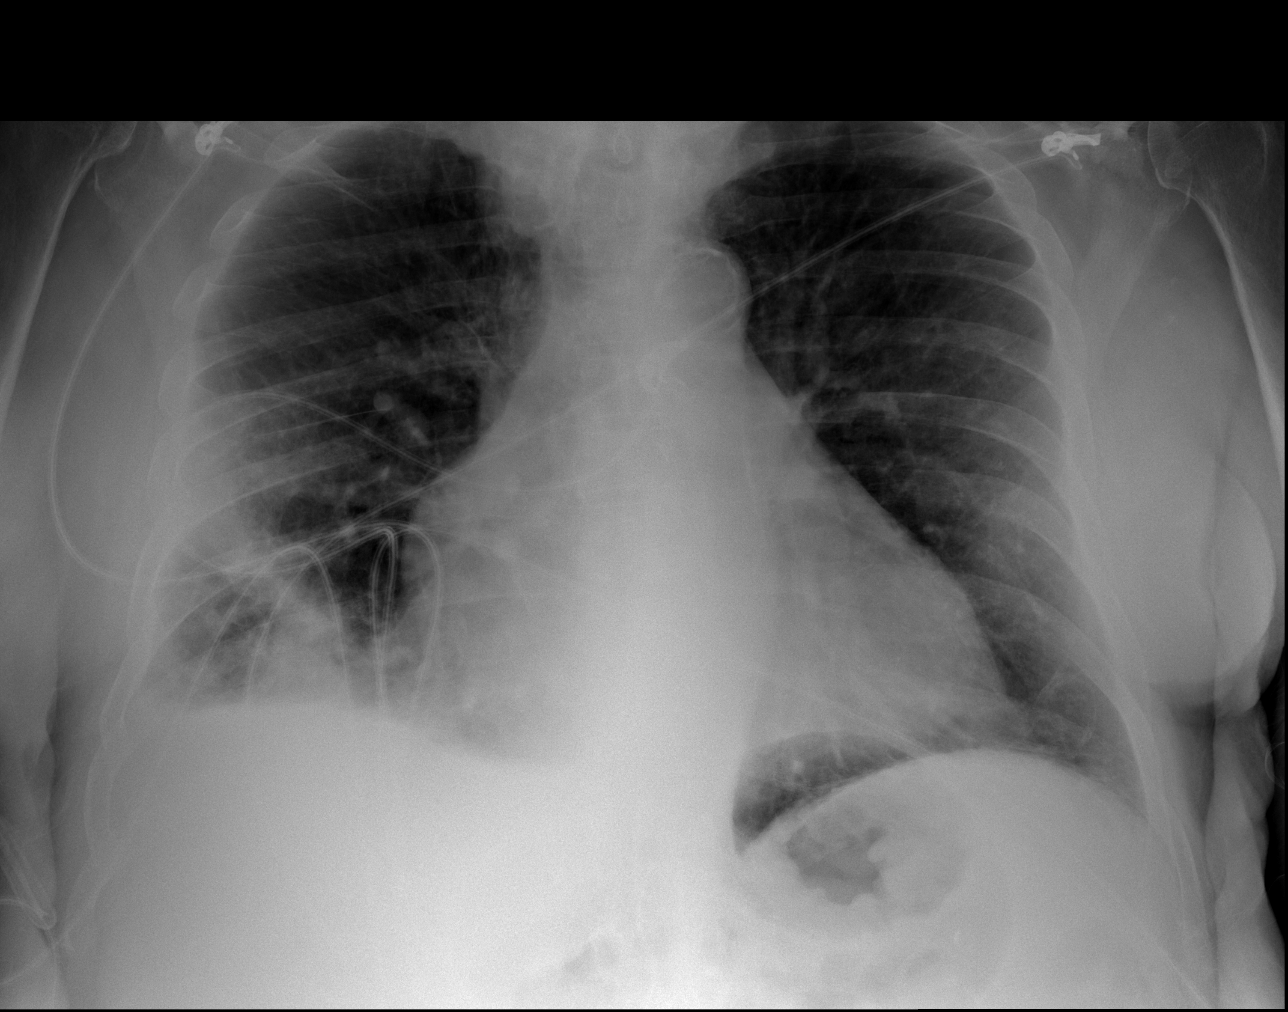
[im 2/3]
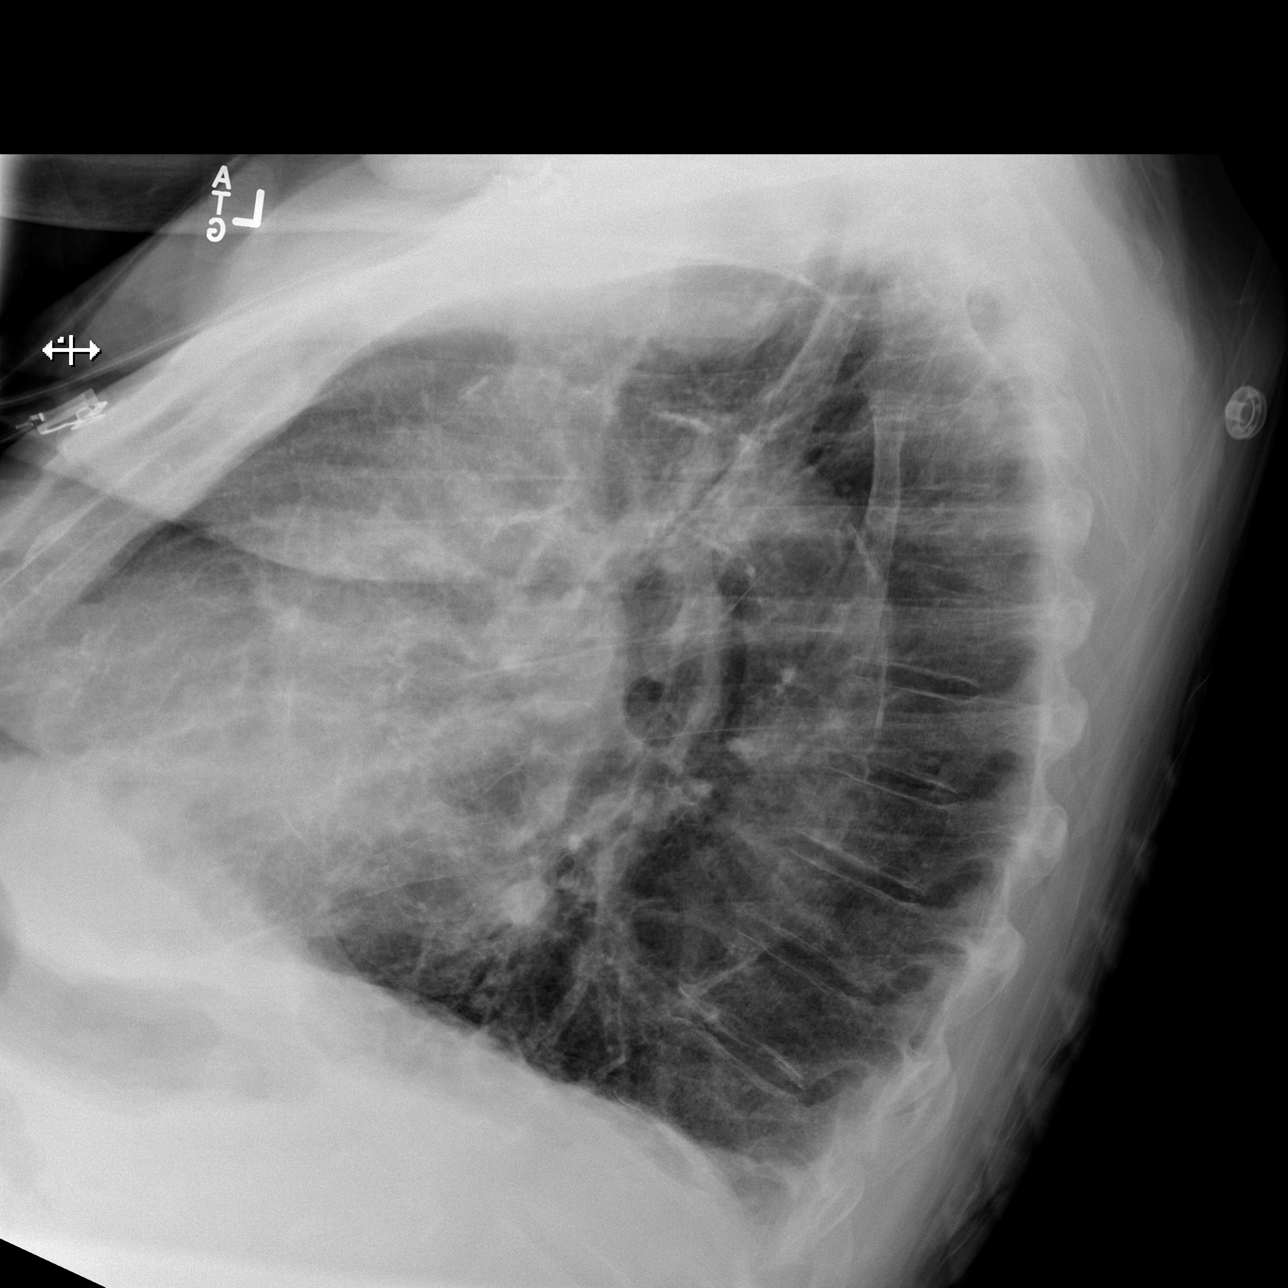
[im 3/3]
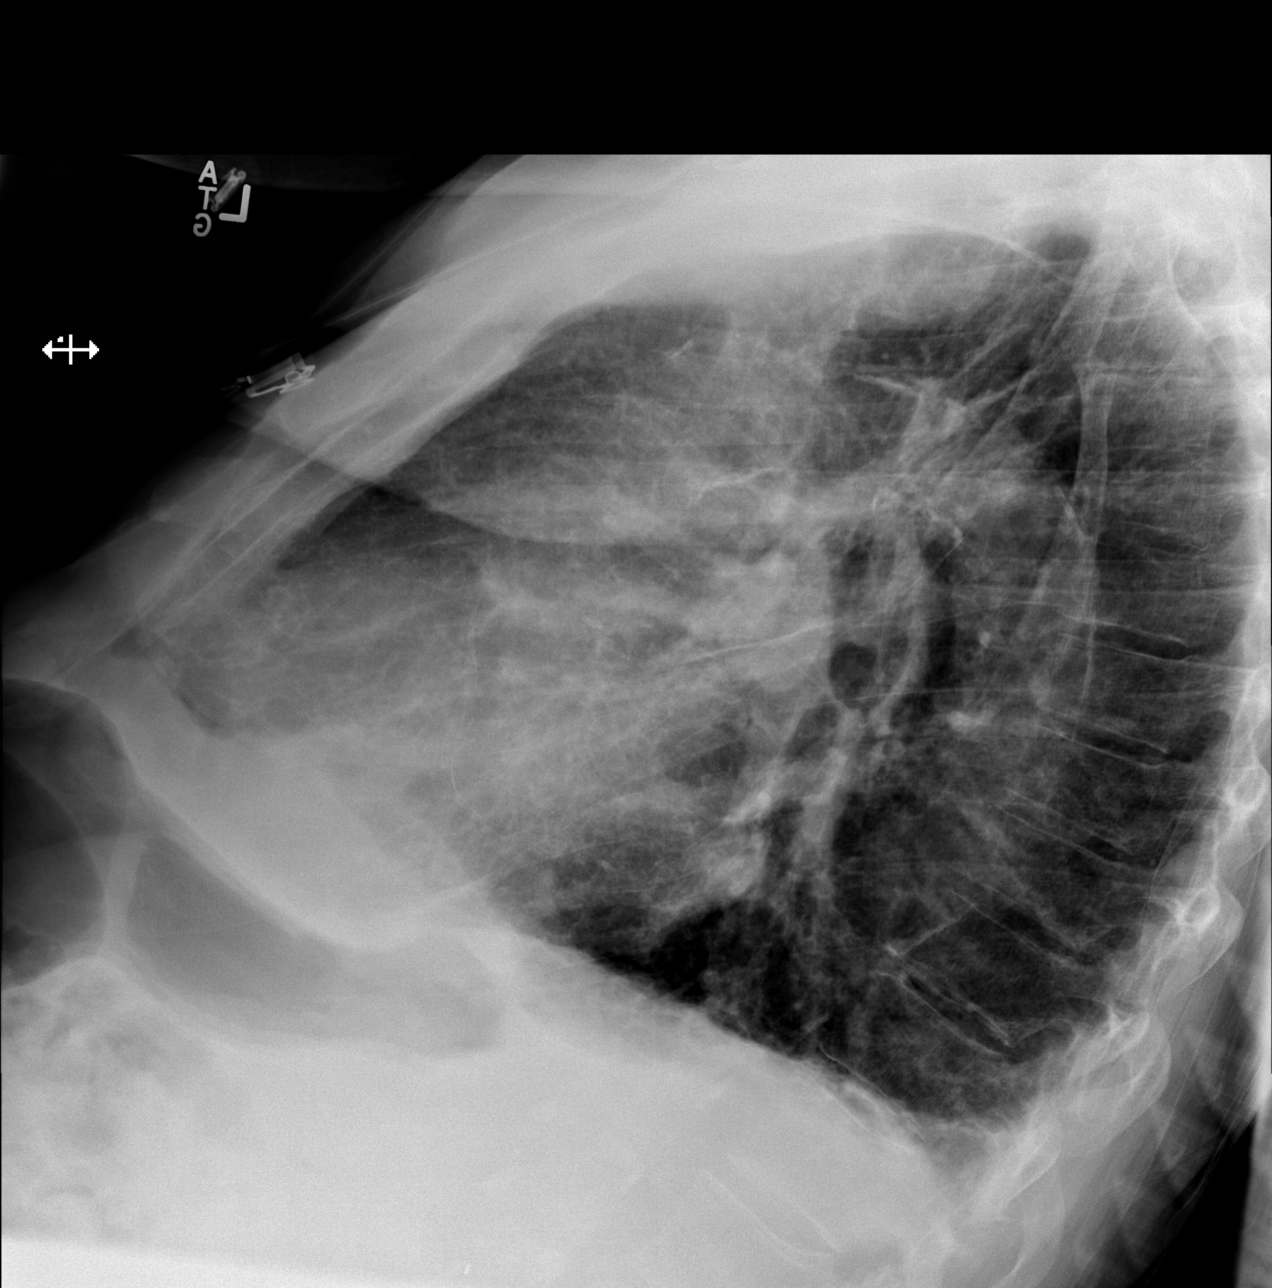

[3 of 3 positions shown; findings below may reference images not displayed]

FINDINGS: Grossly unchanged enlarged cardiac silhouette and mediastinal
contours given slightly reduced lung volumes. Atherosclerotic plaque
within the thoracic aorta. There is unchanged thickening of the
right peritracheal stripe presumably secondary to prominent
vasculature. Ill-defined heterogeneous airspace opacities within the
right mid and lower lung are unchanged. Unchanged small left-sided
effusion. The lungs remain hyperexpanded with flattening of the
bilateral hemidiaphragms and diffuse slightly nodular thickening of
the pulmonary interstitium. No new focal airspace opacities. No
evidence of edema. Suspected hiatal hernia. Unchanged bones.
IMPRESSION: 1. Grossly unchanged ill-defined heterogeneous airspace opacities
within the right mid and lower lung with associated small
right-sided parapneumonic effusion. A follow-up chest radiograph in
4 to 6 weeks after treatment is recommended to ensure resolution.
2. Similar findings of lung hyperexpansion and bronchitic change.

## 2017-01-23 IMAGING — CT CT MAXILLOFACIAL W/O CM
4 of 6 series · 16 of 47 positions shown, 17 images · non-contrast
Comparison: Prior MRI from 02/07/2012.

CLINICAL DATA: Initial evaluation for acute trauma, fall.

EXAM:
CT HEAD WITHOUT CONTRAST
CT MAXILLOFACIAL WITHOUT CONTRAST
TECHNIQUE: Multidetector CT imaging of the head and maxillofacial structures
were performed using the standard protocol without intravenous
contrast. Multiplanar CT image reconstructions of the maxillofacial
structures were also generated.

[Series 2: head wo · axial · 0.47mm/px · z∈[-110,-60]mm · 2 of 30 slices shown, 3 images]
[im 10/30  brain]
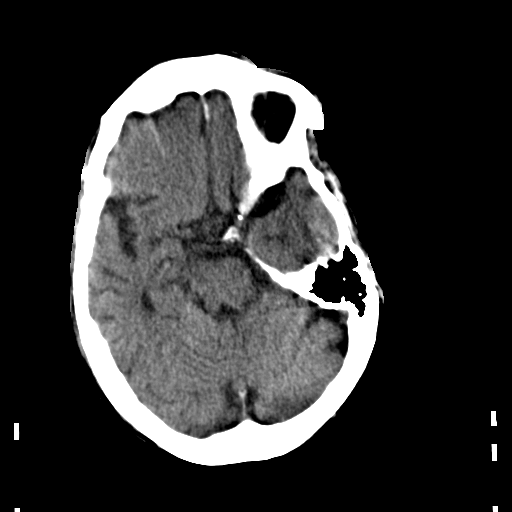
[im 10/30  bone]
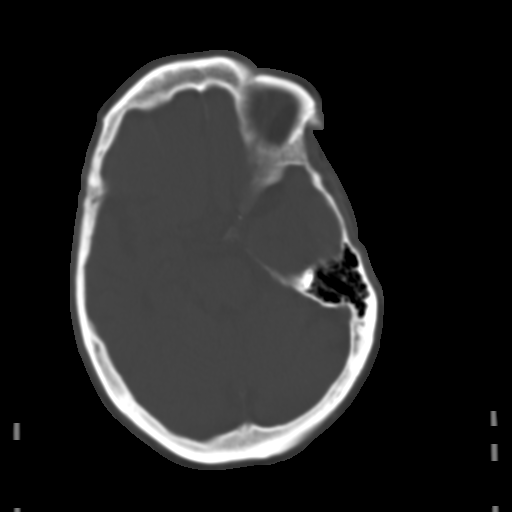
[im 20/30  bone]
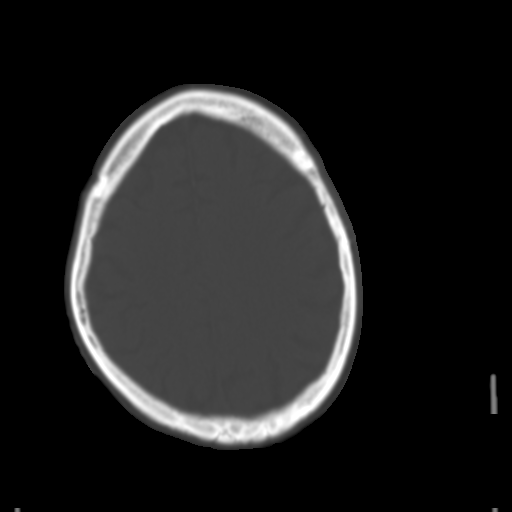

[Series 7: head bone recon · axial · 0.40mm/px · z∈[-146,-41]mm · 8 of 70 slices shown]
[im 8/70  bone]
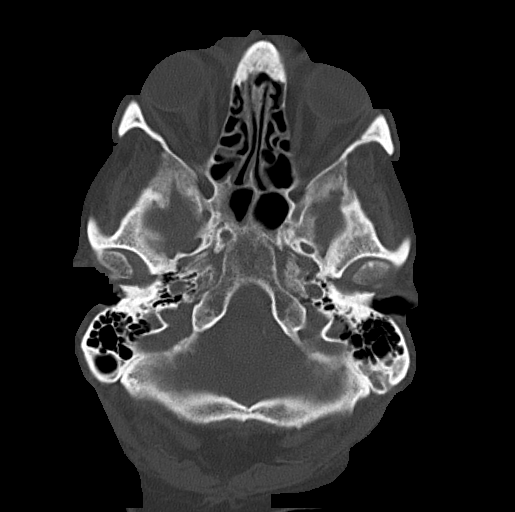
[im 16/70  bone]
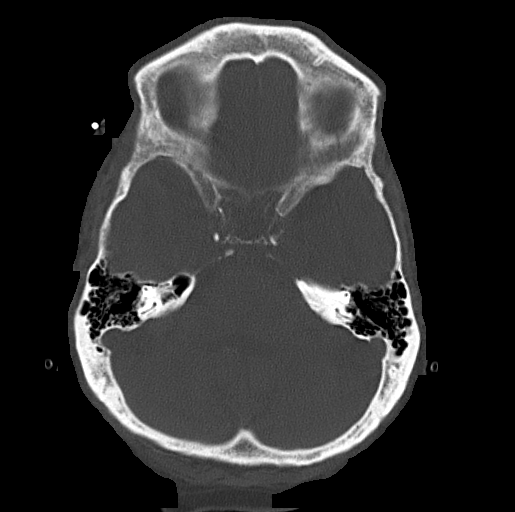
[im 24/70  bone]
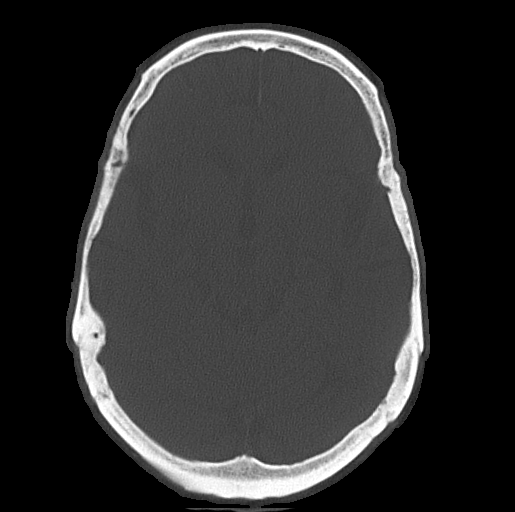
[im 31/70  bone]
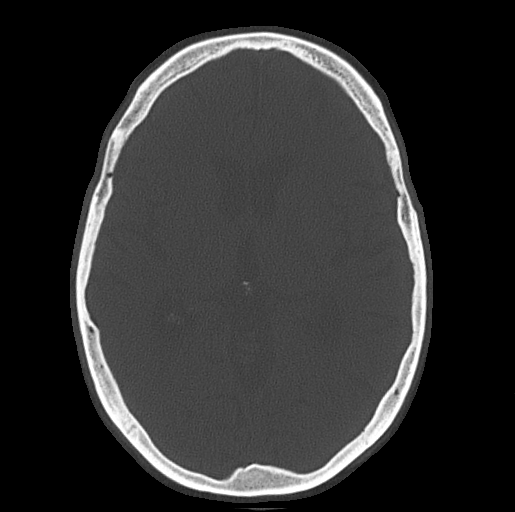
[im 39/70  bone]
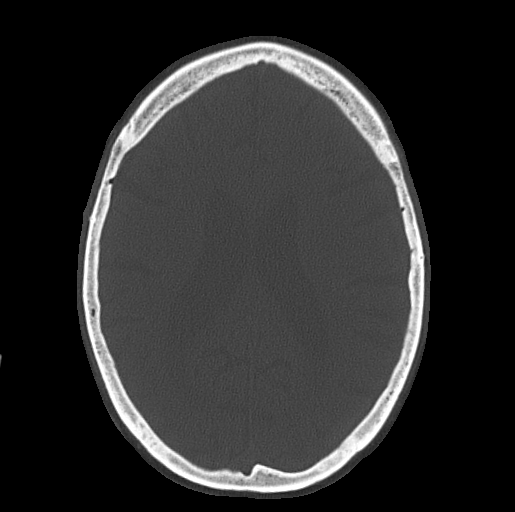
[im 47/70  bone]
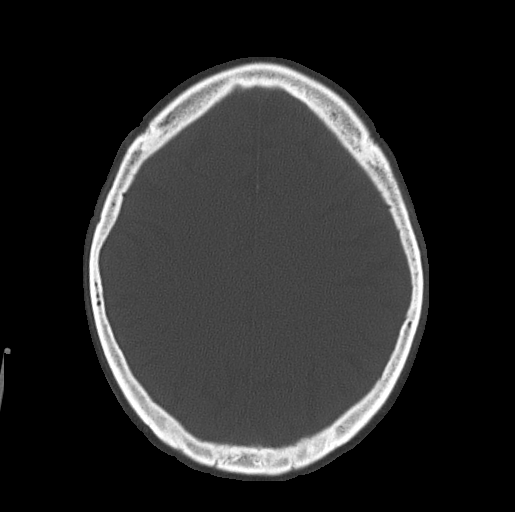
[im 54/70  bone]
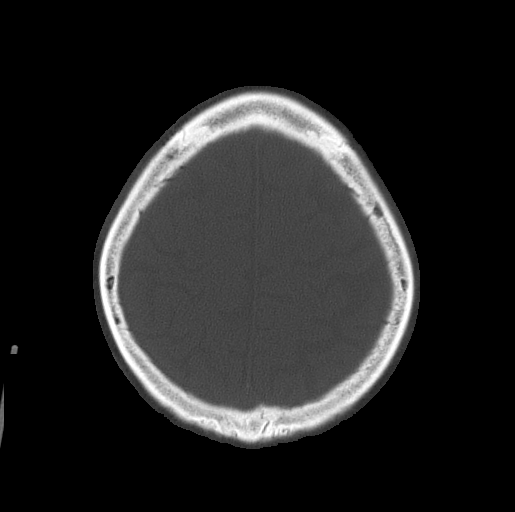
[im 62/70  bone]
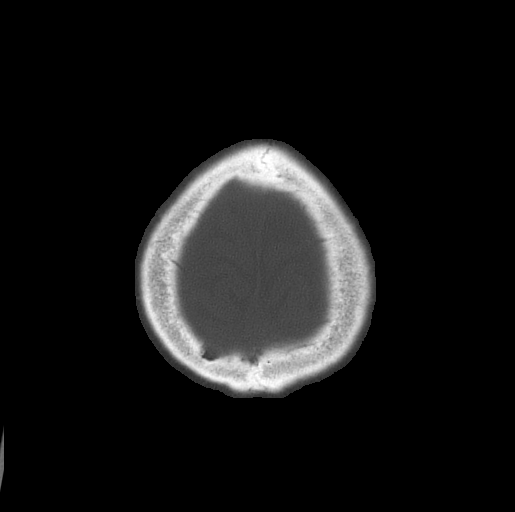

[Series 8: coronal soft · coronal · 0.37mm/px · 3 of 74 slices shown]
[im 25/74  bone]
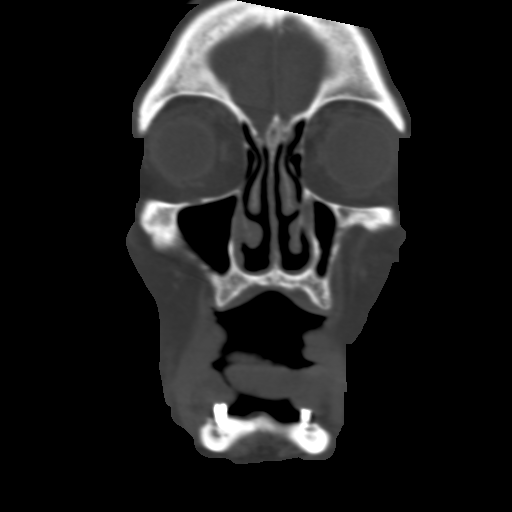
[im 33/74  bone]
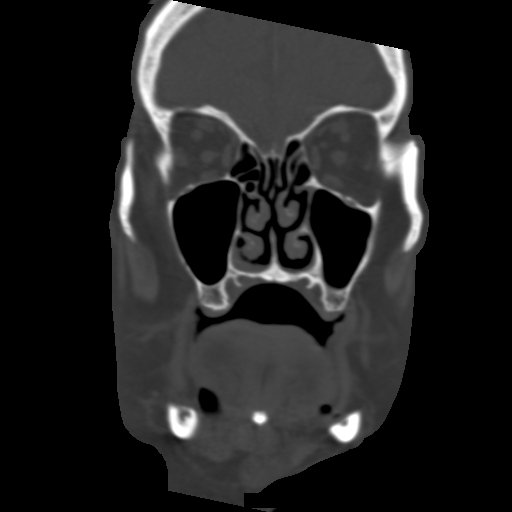
[im 41/74  bone]
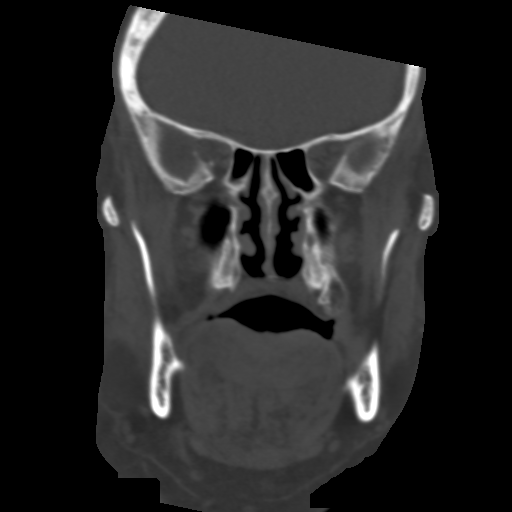

[Series 9: sagittal soft · sagittal · 0.38mm/px · 3 of 87 slices shown]
[im 35/87  bone]
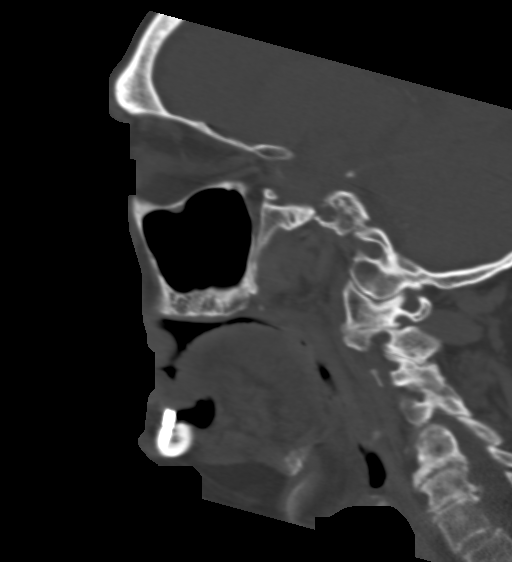
[im 44/87  bone]
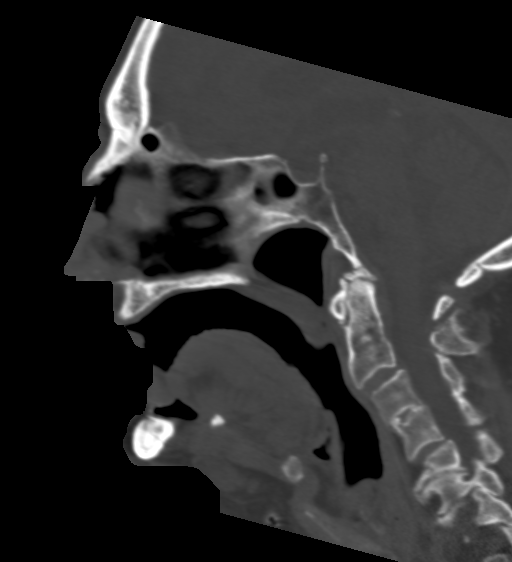
[im 53/87  bone]
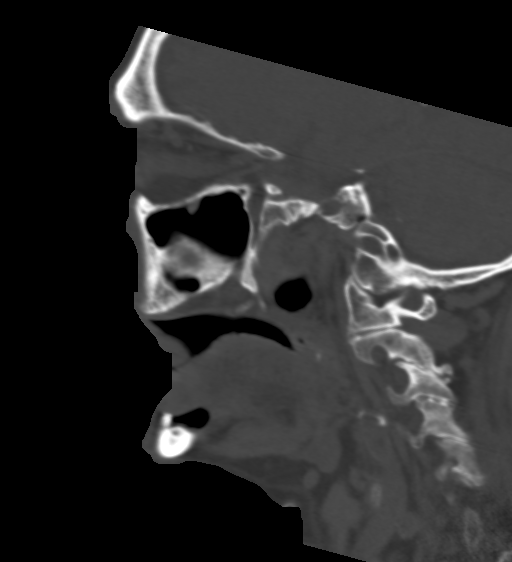

[16 of 47 positions shown; findings below may reference images not displayed]

FINDINGS: CT HEAD FINDINGS

Diffuse prominence of the CSF containing spaces is compatible with
generalized cerebral atrophy. Patchy and confluent hypodensity
within the periventricular and deep white matter both cerebral
hemispheres most consistent with chronic small vessel ischemic
changes. Vascular calcifications present within the carotid siphons
and distal vertebral arteries.

No acute large vessel territory infarct. No intracranial hemorrhage.
No extra-axial fluid collection.

No mass lesion, midline shift, or mass effect.  No hydrocephalus.

The calvarium intact.

Mastoid air cells are clear.

Small left frontotemporal scalp contusion present.

CT MAXILLOFACIAL FINDINGS

Small left periorbital contusion present. Globes are intact. No
retro-orbital pathology present. Sequelae of prior bilateral lens
extraction present. No other significant facial soft tissue
swelling.

Bony orbits are intact without evidence orbital floor fracture.

Zygomatic arches are intact. Maxilla intact. Pterygoid plates
intact. Deformity about the bilateral nasal bones appears chronic in
nature without acute nasal bone fracture. Nasal septum intact.

Mandible intact. Mandibular condyles normally situated within the
temporomandibular fossa. Patient is edentulous.

Mild mucosal thickening present within the right sphenoid sinus and
posterior right ethmoidal air cells. Paranasal sinuses are otherwise
clear.

The visualized upper cervical spine demonstrates no acute
abnormality. Prominent degenerative changes present at C1-2, C5-6,
and C6-7.
IMPRESSION: CT HEAD:

1. No acute intracranial process.
2. Small left frontotemporal scalp contusion.
3. Generalized age-related cerebral atrophy with chronic
microvascular ischemic disease.

CT MAXILLOFACIAL:

1. No acute maxillofacial fracture.
2. Small left periorbital contusion. Intact globes with no
retro-orbital pathology.
